# Patient Record
Sex: Female | Born: 1970 | Race: White | Hispanic: No | Marital: Married | Smoking: Never smoker
Health system: Southern US, Community
[De-identification: ages and names within clinical notes are randomized; demographics above are authoritative.]

## PROBLEM LIST (undated history)

## (undated) DIAGNOSIS — T4145XA Adverse effect of unspecified anesthetic, initial encounter: Secondary | ICD-10-CM

## (undated) DIAGNOSIS — T8859XA Other complications of anesthesia, initial encounter: Secondary | ICD-10-CM

## (undated) DIAGNOSIS — R112 Nausea with vomiting, unspecified: Secondary | ICD-10-CM

## (undated) DIAGNOSIS — I10 Essential (primary) hypertension: Secondary | ICD-10-CM

## (undated) DIAGNOSIS — J45909 Unspecified asthma, uncomplicated: Secondary | ICD-10-CM

## (undated) DIAGNOSIS — Z9889 Other specified postprocedural states: Secondary | ICD-10-CM

## (undated) DIAGNOSIS — D649 Anemia, unspecified: Secondary | ICD-10-CM

## (undated) DIAGNOSIS — R51 Headache: Secondary | ICD-10-CM

---

## 1993-11-25 HISTORY — PX: TUBAL LIGATION: SHX77

## 1996-11-25 HISTORY — PX: BACK SURGERY: SHX140

## 2006-11-25 DIAGNOSIS — I10 Essential (primary) hypertension: Secondary | ICD-10-CM

## 2006-11-25 HISTORY — DX: Essential (primary) hypertension: I10

## 2008-05-12 ENCOUNTER — Ambulatory Visit (HOSPITAL_COMMUNITY): Admission: RE | Admit: 2008-05-12 | Discharge: 2008-05-12 | Payer: Self-pay | Admitting: Specialist

## 2009-11-25 HISTORY — PX: OTHER SURGICAL HISTORY: SHX169

## 2010-11-25 HISTORY — PX: CHOLECYSTECTOMY: SHX55

## 2011-11-26 HISTORY — PX: APPENDECTOMY: SHX54

## 2011-11-26 HISTORY — PX: VAGINAL HYSTERECTOMY: SUR661

## 2012-11-24 DIAGNOSIS — M47812 Spondylosis without myelopathy or radiculopathy, cervical region: Secondary | ICD-10-CM | POA: Diagnosis present

## 2012-11-24 NOTE — H&P (Signed)
History of Present Illness The patient is a 41 year old female who presents today for follow up of their neck. The patient is being followed for their left-sided neck pain. Symptoms reported today include: pain. The patient feels that they are doing poorly and report their pain level to be severe. The patient presents today following SNRB (S/P 11 days, no relief). Currently taking tramadol, which is not helping with the pain. Worked on Monday after injection (11/25), but has not been back to work due to pain.  Subjective Transcription  She returns today for follow-up. Unfortunately, she continues to have severe debilitating neck and left arm pain and now increasing right shoulder and scapular pain. The recent C5 injection did not provide the relief that the previous one did. At this point, her quality of life is deteriorating.   Allergies No Known Drug Allergies. 07/21/2012   Social History Alcohol use. never consumed alcohol Children. 2 Current work status. working full time Drug/Alcohol Rehab (Currently). no Drug/Alcohol Rehab (Previously). no Illicit drug use. no Living situation. live with spouse Marital status. married Pain Contract. no Tobacco / smoke exposure. yes Tobacco use. never smoker   Medication History TraMADol HCl (50MG  Tablet, 1 (one) Tablet Oral 1 PO TID, Taken starting 10/02/2012) Active. SUMAtriptan Succinate (50MG  Tablet, Oral) Active. Promethazine HCl (25MG  Tablet, Oral) Active. Orphenadrine Citrate ER (100MG  Tablet ER 12HR, Oral) Active. Naproxen (500MG  Tablet, Oral) Active. Metoprolol Succinate ER (25MG  Tablet ER 24HR, Oral) Active. Lisinopril-Hydrochlorothiazide (20-25MG  Tablet, Oral) Active. Cyclobenzaprine HCl (5MG  Tablet, Oral) Active. Benzonatate (100MG  Capsule, Oral) Active.   Objective Transcription  She has numbness and dysesthesias in the C4 and C5 distribution on the left side. She has pain with Spurling's  maneuver on the left side. There is no C6, C7 or C8 motor sensory deficits in the upper extremities. No SOB/CP Lungs CTA Heart: RRR Abd: soft/NT.  No history of incontinence Ambulating NO shoulder/elbow/wrist pain with ROM Intact UE pulses Motor: R: 5/5 throughout  L: trace deltoid weakness only Sensation and pain in C5 distribution on left side  Neg babinski/clonus. 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    Plans Transcription  We have had a long discussion about surgical intervention. The risks of surgery include infection, bleeding, nerve damage, death, stroke, paralysis, ongoing or worse pain, throat pain, swallowing difficulties, hoarseness in the voice, need for further surgery, nonunion and adjacent segment disease. At this point in time, we have discussed an anterior cervical discectomy and fusion C4-C5 which I think would be the treatment of choice. I have given her some literature and she will contact me and let me know how she would like to proceed. I will see her back in two weeks and we will make a final decision. If she decides before then she will let me know and we will start the process. We will take her out of work as this is only exacerbating her pain.  Alvy Beal, MD/ljs

## 2012-11-27 ENCOUNTER — Encounter (HOSPITAL_COMMUNITY): Payer: Self-pay | Admitting: Pharmacy Technician

## 2012-12-03 ENCOUNTER — Encounter (HOSPITAL_COMMUNITY): Payer: Self-pay

## 2012-12-03 ENCOUNTER — Encounter (HOSPITAL_COMMUNITY)
Admission: RE | Admit: 2012-12-03 | Discharge: 2012-12-03 | Disposition: A | Discharge status: Home / Self Care | Payer: Worker's Compensation | Source: Ambulatory Visit | Attending: Orthopedic Surgery | Admitting: Orthopedic Surgery

## 2012-12-03 HISTORY — DX: Unspecified asthma, uncomplicated: J45.909

## 2012-12-03 HISTORY — DX: Essential (primary) hypertension: I10

## 2012-12-03 LAB — BASIC METABOLIC PANEL
Chloride: 100 mEq/L (ref 96–112)
GFR calc Af Amer: >90 mL/min (ref >90)
GFR calc non Af Amer: >90 mL/min (ref >90)
Potassium: 3.2 mEq/L — ABNORMAL LOW (ref 3.5–5.1)

## 2012-12-03 LAB — SURGICAL PCR SCREEN
MRSA, PCR: NEGATIVE
Staphylococcus aureus: POSITIVE — AB

## 2012-12-03 LAB — CBC
HCT: 44.1 % (ref 36.0–46.0)
Hemoglobin: 14.7 g/dL (ref 12.0–15.0)
RDW: 13.9 % (ref 11.5–15.5)
WBC: 10.5 10*3/uL (ref 4.0–10.5)

## 2012-12-03 NOTE — Progress Notes (Signed)
Pthere for PAT. Family MD: Dr. Carrington Clamp.  Denies sleep apnea.  Reports having EKG & CXR last summer(2013) at [facility]-faxed req for copies.  Pt stated had ECHO and Stress approx 4 years ago-which were negative.  Denies SOB, & chest pain. Faxed req for copies of ECHO and Stress from family MD and [facility].

## 2012-12-03 NOTE — Pre-Procedure Instructions (Signed)
Julia Jarvis [facility]  12/03/2012   Your procedure is scheduled on:  Wednesday, 12/09/2012@1045AM .  Report to [facility] at  8:30AM.  Call this number if you have problems the morning of surgery: [phone]   Remember:   Do not eat food or drink liquids after midnight.   Take these medicines the morning of surgery with A SIP OF WATER: Norco   Do not wear jewelry, make-up or nail polish.  Do not wear lotions, powders, or perfumes. You may wear deodorant.  Do not shave 48 hours prior to surgery. Men may shave face and neck.  Do not bring valuables to the hospital.  Contacts, dentures or bridgework may not be worn into surgery.  Leave suitcase in the car. After surgery it may be brought to your room.  For patients admitted to the hospital, checkout time is 11:00 AM the day of discharge.   Patients discharged the day of surgery will not be allowed to drive home.  Name and phone number of your driver: husband, Julia Jarvis  Special Instructions: Shower using CHG 2 nights before surgery and the night before surgery.  If you shower the day of surgery use CHG.  Use special wash - you have one bottle of CHG for all showers.  You should use approximately 1/3 of the bottle for each shower.   Please read over the following fact sheets that you were given: Pain Booklet, Coughing and Deep Breathing and Surgical Site Infection Prevention

## 2012-12-04 NOTE — Consult Note (Signed)
Chart to be reviewed by Anesthesiologist Dr. Noreene Larsson.  Shonna Chock, PA-C 12/04/12 1815

## 2012-12-05 NOTE — Consult Note (Signed)
Anesthesiology Chart Review:  42 year old female with C4 radiculopathy felt due to C4-5 foraminal stenosis for C4-5 ACDF by Dr. Shon Baton on 12/09/12.  PMH includes migraines and hypertension. Labs reviewed and are acceptable, ECG pending. She should be OK for surgery, the anesthesiologist assigned to her case will evaluate her prior to surgery.  Kipp Brood, MD

## 2012-12-08 MED ORDER — CEFAZOLIN SODIUM-DEXTROSE 2-3 GM-% IV SOLR
2.0000 g | INTRAVENOUS | Status: AC
  Administered 2012-12-09: 2 g via INTRAVENOUS
  Filled 2012-12-08: qty 50

## 2012-12-08 NOTE — Progress Notes (Signed)
Unable to reach patient to notify of surgery time change and new arrival time.  Phone only rings busy. No other numbers available

## 2012-12-09 ENCOUNTER — Encounter (HOSPITAL_COMMUNITY): Payer: Self-pay | Admitting: Vascular Surgery

## 2012-12-09 ENCOUNTER — Ambulatory Visit (HOSPITAL_COMMUNITY): Payer: Worker's Compensation | Admitting: Vascular Surgery

## 2012-12-09 ENCOUNTER — Encounter (HOSPITAL_COMMUNITY): Payer: Self-pay | Admitting: Surgery

## 2012-12-09 ENCOUNTER — Observation Stay (HOSPITAL_COMMUNITY): Payer: Worker's Compensation

## 2012-12-09 ENCOUNTER — Ambulatory Visit (HOSPITAL_COMMUNITY): Payer: Worker's Compensation

## 2012-12-09 ENCOUNTER — Encounter (HOSPITAL_COMMUNITY)
Admission: RE | Disposition: A | Discharge status: Home / Self Care | Payer: Self-pay | Source: Ambulatory Visit | Attending: Orthopedic Surgery

## 2012-12-09 ENCOUNTER — Observation Stay (HOSPITAL_COMMUNITY)
Admission: RE | Admit: 2012-12-09 | Discharge: 2012-12-10 | Disposition: A | Discharge status: Home / Self Care | Payer: Worker's Compensation | Source: Ambulatory Visit | Attending: Orthopedic Surgery | Admitting: Orthopedic Surgery

## 2012-12-09 DIAGNOSIS — I10 Essential (primary) hypertension: Secondary | ICD-10-CM | POA: Insufficient documentation

## 2012-12-09 DIAGNOSIS — M47812 Spondylosis without myelopathy or radiculopathy, cervical region: Principal | ICD-10-CM | POA: Insufficient documentation

## 2012-12-09 DIAGNOSIS — M5030 Other cervical disc degeneration, unspecified cervical region: Secondary | ICD-10-CM | POA: Insufficient documentation

## 2012-12-09 DIAGNOSIS — J45909 Unspecified asthma, uncomplicated: Secondary | ICD-10-CM | POA: Insufficient documentation

## 2012-12-09 HISTORY — PX: ANTERIOR CERVICAL DECOMP/DISCECTOMY FUSION: SHX1161

## 2012-12-09 SURGERY — ANTERIOR CERVICAL DECOMPRESSION/DISCECTOMY FUSION 1 LEVEL
Anesthesia: General | Site: Back | Wound class: Clean

## 2012-12-09 MED ORDER — DOCUSATE SODIUM 100 MG PO CAPS: 100.0000 mg | ORAL_CAPSULE | Freq: Three times a day (TID) | ORAL | Status: DC | PRN

## 2012-12-09 MED ORDER — ZOLPIDEM TARTRATE 5 MG PO TABS: 5.0000 mg | ORAL_TABLET | Freq: Every evening | ORAL | Status: DC | PRN

## 2012-12-09 MED ORDER — ARTIFICIAL TEARS OP OINT
TOPICAL_OINTMENT | OPHTHALMIC | Status: DC | PRN
  Administered 2012-12-09: 1 via OPHTHALMIC

## 2012-12-09 MED ORDER — GLYCOPYRROLATE 0.2 MG/ML IJ SOLN
INTRAMUSCULAR | Status: DC | PRN
  Administered 2012-12-09: 0.4 mg via INTRAVENOUS

## 2012-12-09 MED ORDER — THROMBIN 20000 UNITS EX SOLR
CUTANEOUS | Status: DC | PRN
  Administered 2012-12-09: 11:00:00 via TOPICAL

## 2012-12-09 MED ORDER — LISINOPRIL-HYDROCHLOROTHIAZIDE 20-25 MG PO TABS
1.0000 | ORAL_TABLET | Freq: Every day | ORAL | Status: DC
Start: 2012-12-09 — End: 2012-12-09

## 2012-12-09 MED ORDER — OXYCODONE HCL 5 MG PO TABS
10.0000 mg | ORAL_TABLET | ORAL | Status: DC | PRN
  Administered 2012-12-10: 10 mg via ORAL
  Filled 2012-12-09: qty 2

## 2012-12-09 MED ORDER — MIDAZOLAM HCL 5 MG/5ML IJ SOLN
INTRAMUSCULAR | Status: DC | PRN
  Administered 2012-12-09: 2 mg via INTRAVENOUS

## 2012-12-09 MED ORDER — LACTATED RINGERS IV SOLN
INTRAVENOUS | Status: DC
  Administered 2012-12-09: 10:00:00 via INTRAVENOUS

## 2012-12-09 MED ORDER — METHOCARBAMOL 500 MG PO TABS
500.0000 mg | ORAL_TABLET | Freq: Four times a day (QID) | ORAL | Status: DC | PRN
  Filled 2012-12-09: qty 1

## 2012-12-09 MED ORDER — ONDANSETRON HCL 4 MG/2ML IJ SOLN: 4.0000 mg | Freq: Once | INTRAMUSCULAR | Status: DC | PRN

## 2012-12-09 MED ORDER — BUPIVACAINE-EPINEPHRINE 0.25% -1:200000 IJ SOLN
INTRAMUSCULAR | Status: AC
  Filled 2012-12-09: qty 1

## 2012-12-09 MED ORDER — METHOCARBAMOL 100 MG/ML IJ SOLN
500.0000 mg | Freq: Four times a day (QID) | INTRAVENOUS | Status: DC | PRN
  Filled 2012-12-09 (×2): qty 5

## 2012-12-09 MED ORDER — LACTATED RINGERS IV SOLN
INTRAVENOUS | Status: DC | PRN
  Administered 2012-12-09 (×2): via INTRAVENOUS

## 2012-12-09 MED ORDER — HYDROMORPHONE HCL PF 1 MG/ML IJ SOLN
0.2500 mg | INTRAMUSCULAR | Status: DC | PRN
  Administered 2012-12-09 (×2): 0.5 mg via INTRAVENOUS

## 2012-12-09 MED ORDER — MORPHINE SULFATE 2 MG/ML IJ SOLN
1.0000 mg | INTRAMUSCULAR | Status: DC | PRN
  Administered 2012-12-09 – 2012-12-10 (×3): 2 mg via INTRAVENOUS
  Filled 2012-12-09 (×3): qty 1

## 2012-12-09 MED ORDER — NEOSTIGMINE METHYLSULFATE 1 MG/ML IJ SOLN
INTRAMUSCULAR | Status: DC | PRN
  Administered 2012-12-09: 3 mg via INTRAVENOUS

## 2012-12-09 MED ORDER — LISINOPRIL 20 MG PO TABS
20.0000 mg | ORAL_TABLET | Freq: Every day | ORAL | Status: DC
  Administered 2012-12-09 – 2012-12-10 (×2): 20 mg via ORAL
  Filled 2012-12-09 (×2): qty 1

## 2012-12-09 MED ORDER — BUPIVACAINE-EPINEPHRINE PF 0.25-1:200000 % IJ SOLN
INTRAMUSCULAR | Status: DC | PRN
  Administered 2012-12-09: 4 mL

## 2012-12-09 MED ORDER — DOCUSATE SODIUM 100 MG PO CAPS
100.0000 mg | ORAL_CAPSULE | Freq: Two times a day (BID) | ORAL | Status: DC
  Administered 2012-12-09 – 2012-12-10 (×2): 100 mg via ORAL
  Filled 2012-12-09 (×2): qty 1

## 2012-12-09 MED ORDER — ONDANSETRON HCL 4 MG PO TABS: 4.0000 mg | ORAL_TABLET | Freq: Three times a day (TID) | ORAL | Status: DC | PRN

## 2012-12-09 MED ORDER — LIDOCAINE HCL 4 % MT SOLN
OROMUCOSAL | Status: DC | PRN
  Administered 2012-12-09: 4 mL via TOPICAL

## 2012-12-09 MED ORDER — DEXAMETHASONE SODIUM PHOSPHATE 10 MG/ML IJ SOLN
INTRAMUSCULAR | Status: AC
  Administered 2012-12-09: 10 mg via INTRAVENOUS
  Filled 2012-12-09: qty 1

## 2012-12-09 MED ORDER — ACETAMINOPHEN 10 MG/ML IV SOLN: 1000.0000 mg | Freq: Once | INTRAVENOUS | Status: DC | PRN

## 2012-12-09 MED ORDER — DEXAMETHASONE SODIUM PHOSPHATE 4 MG/ML IJ SOLN
4.0000 mg | Freq: Four times a day (QID) | INTRAMUSCULAR | Status: DC
  Administered 2012-12-10: 4 mg via INTRAVENOUS
  Filled 2012-12-09 (×7): qty 1

## 2012-12-09 MED ORDER — ROCURONIUM BROMIDE 100 MG/10ML IV SOLN
INTRAVENOUS | Status: DC | PRN
  Administered 2012-12-09: 50 mg via INTRAVENOUS

## 2012-12-09 MED ORDER — METHOCARBAMOL 500 MG PO TABS: 500.0000 mg | ORAL_TABLET | Freq: Three times a day (TID) | ORAL | Status: DC | PRN

## 2012-12-09 MED ORDER — OXYCODONE-ACETAMINOPHEN 10-325 MG PO TABS: 1.0000 | ORAL_TABLET | ORAL | Status: DC | PRN

## 2012-12-09 MED ORDER — ONDANSETRON HCL 4 MG/2ML IJ SOLN
INTRAMUSCULAR | Status: DC | PRN
  Administered 2012-12-09: 4 mg via INTRAVENOUS

## 2012-12-09 MED ORDER — LIDOCAINE HCL (CARDIAC) 20 MG/ML IV SOLN
INTRAVENOUS | Status: DC | PRN
  Administered 2012-12-09: 40 mg via INTRAVENOUS

## 2012-12-09 MED ORDER — ACETAMINOPHEN 10 MG/ML IV SOLN
1000.0000 mg | Freq: Once | INTRAVENOUS | Status: AC
  Administered 2012-12-09: 1000 mg via INTRAVENOUS
  Filled 2012-12-09: qty 100

## 2012-12-09 MED ORDER — ACETAMINOPHEN 10 MG/ML IV SOLN
INTRAVENOUS | Status: AC
  Filled 2012-12-09: qty 100

## 2012-12-09 MED ORDER — DEXAMETHASONE SODIUM PHOSPHATE 4 MG/ML IJ SOLN
4.0000 mg | Freq: Once | INTRAMUSCULAR | Status: DC
  Filled 2012-12-09: qty 1

## 2012-12-09 MED ORDER — PHENOL 1.4 % MT LIQD
1.0000 | OROMUCOSAL | Status: DC | PRN
  Filled 2012-12-09: qty 177

## 2012-12-09 MED ORDER — EPHEDRINE SULFATE 50 MG/ML IJ SOLN
INTRAMUSCULAR | Status: DC | PRN
  Administered 2012-12-09 (×2): 5 mg via INTRAVENOUS

## 2012-12-09 MED ORDER — MENTHOL 3 MG MT LOZG: 1.0000 | LOZENGE | OROMUCOSAL | Status: DC | PRN

## 2012-12-09 MED ORDER — VECURONIUM BROMIDE 10 MG IV SOLR
INTRAVENOUS | Status: DC | PRN
  Administered 2012-12-09: 1 mg via INTRAVENOUS

## 2012-12-09 MED ORDER — PROPOFOL 10 MG/ML IV BOLUS
INTRAVENOUS | Status: DC | PRN
  Administered 2012-12-09: 180 mg via INTRAVENOUS

## 2012-12-09 MED ORDER — HYDROMORPHONE HCL PF 1 MG/ML IJ SOLN
INTRAMUSCULAR | Status: AC
  Filled 2012-12-09: qty 1

## 2012-12-09 MED ORDER — CEFAZOLIN SODIUM 1-5 GM-% IV SOLN
1.0000 g | Freq: Three times a day (TID) | INTRAVENOUS | Status: AC
  Administered 2012-12-09 – 2012-12-10 (×2): 1 g via INTRAVENOUS
  Filled 2012-12-09 (×2): qty 50

## 2012-12-09 MED ORDER — METHOCARBAMOL 100 MG/ML IJ SOLN
500.0000 mg | INTRAVENOUS | Status: DC
  Administered 2012-12-09: 500 mg via INTRAVENOUS
  Filled 2012-12-09: qty 5

## 2012-12-09 MED ORDER — DEXAMETHASONE 4 MG PO TABS
4.0000 mg | ORAL_TABLET | Freq: Four times a day (QID) | ORAL | Status: DC
  Administered 2012-12-09 – 2012-12-10 (×2): 4 mg via ORAL
  Filled 2012-12-09 (×7): qty 1

## 2012-12-09 MED ORDER — FENTANYL CITRATE 0.05 MG/ML IJ SOLN
INTRAMUSCULAR | Status: DC | PRN
  Administered 2012-12-09 (×5): 50 ug via INTRAVENOUS
  Administered 2012-12-09: 100 ug via INTRAVENOUS

## 2012-12-09 MED ORDER — POLYETHYLENE GLYCOL 3350 17 GM/SCOOP PO POWD: 17.0000 g | Freq: Every day | ORAL | Status: DC

## 2012-12-09 MED ORDER — ACETAMINOPHEN 10 MG/ML IV SOLN
1000.0000 mg | Freq: Four times a day (QID) | INTRAVENOUS | Status: DC
  Administered 2012-12-09 – 2012-12-10 (×3): 1000 mg via INTRAVENOUS
  Filled 2012-12-09 (×4): qty 100

## 2012-12-09 MED ORDER — THROMBIN 20000 UNITS EX SOLR
CUTANEOUS | Status: AC
  Filled 2012-12-09: qty 20000

## 2012-12-09 MED ORDER — LACTATED RINGERS IV SOLN
INTRAVENOUS | Status: DC
  Administered 2012-12-09: 15:00:00 via INTRAVENOUS

## 2012-12-09 MED ORDER — HYDROCHLOROTHIAZIDE 25 MG PO TABS
25.0000 mg | ORAL_TABLET | Freq: Every day | ORAL | Status: DC
  Administered 2012-12-09 – 2012-12-10 (×2): 25 mg via ORAL
  Filled 2012-12-09 (×2): qty 1

## 2012-12-09 MED ORDER — 0.9 % SODIUM CHLORIDE (POUR BTL) OPTIME
TOPICAL | Status: DC | PRN
  Administered 2012-12-09: 1000 mL

## 2012-12-09 MED ORDER — FLEET ENEMA 7-19 GM/118ML RE ENEM: 1.0000 | ENEMA | Freq: Once | RECTAL | Status: AC | PRN

## 2012-12-09 SURGICAL SUPPLY — 61 items
BLADE SURG ROTATE 9660 (MISCELLANEOUS) IMPLANT
BUR EGG ELITE 4.0 (BURR) IMPLANT
BUR MATCHSTICK NEURO 3.0 LAGG (BURR) IMPLANT
CANISTER SUCTION 2500CC (MISCELLANEOUS) ×2 IMPLANT
CLOTH BEACON ORANGE TIMEOUT ST (SAFETY) ×2 IMPLANT
CLSR STERI-STRIP ANTIMIC 1/2X4 (GAUZE/BANDAGES/DRESSINGS) ×2 IMPLANT
CORDS BIPOLAR (ELECTRODE) ×2 IMPLANT
COVER SURGICAL LIGHT HANDLE (MISCELLANEOUS) ×4 IMPLANT
CRADLE DONUT ADULT HEAD (MISCELLANEOUS) ×2 IMPLANT
DERMABOND ADVANCED (GAUZE/BANDAGES/DRESSINGS) ×1
DERMABOND ADVANCED .7 DNX12 (GAUZE/BANDAGES/DRESSINGS) ×1 IMPLANT
DRAPE C-ARM 42X72 X-RAY (DRAPES) ×2 IMPLANT
DRAPE POUCH INSTRU U-SHP 10X18 (DRAPES) ×2 IMPLANT
DRAPE SURG 17X23 STRL (DRAPES) ×2 IMPLANT
DRAPE U-SHAPE 47X51 STRL (DRAPES) ×2 IMPLANT
DRILL BIT (BIT) ×2 IMPLANT
DRSG MEPILEX BORDER 4X4 (GAUZE/BANDAGES/DRESSINGS) ×2 IMPLANT
DURAPREP 26ML APPLICATOR (WOUND CARE) ×2 IMPLANT
ELECT COATED BLADE 2.86 ST (ELECTRODE) ×2 IMPLANT
ELECT REM PT RETURN 9FT ADLT (ELECTROSURGICAL) ×2
ELECTRODE REM PT RTRN 9FT ADLT (ELECTROSURGICAL) ×1 IMPLANT
GLOVE BIOGEL PI IND STRL 6.5 (GLOVE) ×1 IMPLANT
GLOVE BIOGEL PI IND STRL 8.5 (GLOVE) ×1 IMPLANT
GLOVE BIOGEL PI INDICATOR 6.5 (GLOVE) ×1
GLOVE BIOGEL PI INDICATOR 8.5 (GLOVE) ×1
GLOVE ECLIPSE 6.0 STRL STRAW (GLOVE) ×2 IMPLANT
GLOVE ECLIPSE 8.5 STRL (GLOVE) ×2 IMPLANT
GOWN PREVENTION PLUS XXLARGE (GOWN DISPOSABLE) ×2 IMPLANT
GOWN STRL NON-REIN LRG LVL3 (GOWN DISPOSABLE) ×4 IMPLANT
INTERLOCK LRDTC CRVCL VBR 7MM (Bone Implant) ×1 IMPLANT
KIT BASIN OR (CUSTOM PROCEDURE TRAY) ×2 IMPLANT
KIT ROOM TURNOVER OR (KITS) ×2 IMPLANT
LORDOTIC CERVICAL VBR 7MM SM (Bone Implant) ×2 IMPLANT
NEEDLE SPNL 18GX3.5 QUINCKE PK (NEEDLE) ×2 IMPLANT
NS IRRIG 1000ML POUR BTL (IV SOLUTION) ×2 IMPLANT
PACK ORTHO CERVICAL (CUSTOM PROCEDURE TRAY) ×2 IMPLANT
PACK UNIVERSAL I (CUSTOM PROCEDURE TRAY) ×2 IMPLANT
PAD ARMBOARD 7.5X6 YLW CONV (MISCELLANEOUS) ×4 IMPLANT
PATTIES SURGICAL .25X.25 (GAUZE/BANDAGES/DRESSINGS) IMPLANT
PIN DISTRACTION 14 (PIN) ×2 IMPLANT
PLATE LV 1 12MM (Plate) ×2 IMPLANT
PUTTY BONE DBX 2.5 MIS (Bone Implant) ×2 IMPLANT
SCREW 14MMX25 (Screw) ×2 IMPLANT
SCREW 4.0X14MM (Screw) ×1 IMPLANT
SCREW 4.0X16MM (Screw) ×2 IMPLANT
SCREW BN 14X4XSLF DRL VA SLF (Screw) ×1 IMPLANT
SLEEVE SURGEON STRL (DRAPES) ×2 IMPLANT
SPONGE INTESTINAL PEANUT (DISPOSABLE) ×2 IMPLANT
SPONGE SURGIFOAM ABS GEL 100 (HEMOSTASIS) ×2 IMPLANT
SURGIFLO TRUKIT (HEMOSTASIS) IMPLANT
SUT MNCRL AB 3-0 PS2 18 (SUTURE) ×2 IMPLANT
SUT SILK 2 0 (SUTURE) ×1
SUT SILK 2-0 18XBRD TIE 12 (SUTURE) ×1 IMPLANT
SUT VIC AB 2-0 CT1 18 (SUTURE) ×2 IMPLANT
SYR BULB IRRIGATION 50ML (SYRINGE) ×2 IMPLANT
SYR CONTROL 10ML LL (SYRINGE) ×2 IMPLANT
TAPE CLOTH 4X10 WHT NS (GAUZE/BANDAGES/DRESSINGS) ×2 IMPLANT
TAPE UMBILICAL COTTON 1/8X30 (MISCELLANEOUS) ×2 IMPLANT
TOWEL OR 17X24 6PK STRL BLUE (TOWEL DISPOSABLE) ×2 IMPLANT
TOWEL OR 17X26 10 PK STRL BLUE (TOWEL DISPOSABLE) ×2 IMPLANT
WATER STERILE IRR 1000ML POUR (IV SOLUTION) ×2 IMPLANT

## 2012-12-09 NOTE — Discharge Summary (Signed)
Patient ID: KEUNDRA PETRUCELLI MRN: [id number] DOB/AGE: 1971/04/10 42 y.o.  Admit date: 12/09/2012 Discharge date: 12/09/2012  Admission Diagnoses:  Principal Problem:  *Cervical spondylosis   Discharge Diagnoses:  Principal Problem:  *Cervical spondylosis  status post Procedure(s): ANTERIOR CERVICAL DECOMPRESSION/DISCECTOMY FUSION 1 LEVEL  Past Medical History  Diagnosis Date  . Hypertension 2008  . Asthma     childhood    Surgeries: Procedure(s): ANTERIOR CERVICAL DECOMPRESSION/DISCECTOMY FUSION 1 LEVEL on 12/09/2012   Consultants:  none  Discharged Condition: Improved  Hospital Course: Julia Jarvis is an 42 y.o. female who was admitted 12/09/2012 for operative treatment of Cervical spondylosis. Patient failed conservative treatments (please see the history and physical for the specifics) and had severe unremitting pain that affects sleep, daily activities and work/hobbies. After pre-op clearance, the patient was taken to the operating room on 12/09/2012 and underwent  Procedure(s): ANTERIOR CERVICAL DECOMPRESSION/DISCECTOMY FUSION 1 LEVEL.    Patient was given perioperative antibiotics: Anti-infectives     Start     Dose/Rate Route Frequency Ordered Stop   12/08/12 1453   ceFAZolin (ANCEF) IVPB 2 g/50 mL premix        2 g 100 mL/hr over 30 Minutes Intravenous 30 min pre-op 12/08/12 1453 12/09/12 1017           Patient was given sequential compression devices and early ambulation to prevent DVT.   Patient benefited maximally from hospital stay and there were no complications. At the time of discharge, the patient was urinating/moving their bowels without difficulty, tolerating a regular diet, pain is controlled with oral pain medications and they have been cleared by PT/OT.   Recent vital signs: Patient Vitals for the past 24 hrs:  BP Temp Temp src Pulse Resp SpO2  12/09/12 0837 162/93 mmHg - - - - -  12/09/12 0832 - 98.2 F (36.8 C) Oral 80  18  100 %      Recent laboratory studies: No results found for this basename: WBC:2,HGB:2,HCT:2,PLT:2,NA:2,K:2,CL:2,CO2:2,BUN:2,CREATININE:2,GLUCOSE:2,PT:2,INR:2,CALCIUM,2: in the last 72 hours   Discharge Medications:     Medication List     As of 12/09/2012 12:32 PM    STOP taking these medications         ADVIL COLD/SINUS 30-200 MG Tabs   Generic drug: Pseudoephedrine-Ibuprofen      HYDROcodone-acetaminophen 5-325 MG per tablet   Commonly known as: NORCO/VICODIN      TAKE these medications         docusate sodium 100 MG capsule   Commonly known as: COLACE   Take 1 capsule (100 mg total) by mouth 3 (three) times daily as needed for constipation.      lisinopril-hydrochlorothiazide 20-25 MG per tablet   Commonly known as: PRINZIDE,ZESTORETIC   Take 1 tablet by mouth daily.      methocarbamol 500 MG tablet   Commonly known as: ROBAXIN   Take 1 tablet (500 mg total) by mouth 3 (three) times daily as needed.      ondansetron 4 MG tablet   Commonly known as: ZOFRAN   Take 1 tablet (4 mg total) by mouth every 8 (eight) hours as needed for nausea.      oxyCODONE-acetaminophen 10-325 MG per tablet   Commonly known as: PERCOCET   Take 1 tablet by mouth every 4 (four) hours as needed for pain.      polyethylene glycol powder powder   Commonly known as: GLYCOLAX/MIRALAX   Take 17 g by mouth daily.  Diagnostic Studies: Dg Chest 2 View  12/09/2012  *RADIOLOGY REPORT*  Clinical Data: Cervical disc disease.  CHEST - 2 VIEW  Comparison: None.  Findings: The heart size and pulmonary vascularity are normal and the lungs are clear.  No significant osseous abnormality.  IMPRESSION: Normal chest.   Original Report Authenticated By: Francene Boyers, M.D.           Follow-up Information    Follow up with Alvy Beal, MD. Call in 2 weeks. (As needed if symptoms worsen)    Contact information:   [address] [place]  [address] [phone]          Discharge Plan:  discharge to .home  Disposition:  Home Doing well  F/u 2 weeks   Signed: Venita Lick D for Dr. Venita Lick [facility] [phone] 12/09/2012, 12:32 PM

## 2012-12-09 NOTE — Preoperative (Signed)
Beta Blockers   Reason not to administer Beta Blockers:Not Applicable 

## 2012-12-09 NOTE — Brief Op Note (Signed)
12/09/2012  12:26 PM  PATIENT:  Julia Jarvis  42 y.o. female  PRE-OPERATIVE DIAGNOSIS:  CERVICAL SPONDYLOTIC RADICULOPATHY   POST-OPERATIVE DIAGNOSIS:  CERVICAL SPONDYLOTIC RADICULOPATHY   PROCEDURE:  Procedure(s) (LRB) with comments: ANTERIOR CERVICAL DECOMPRESSION/DISCECTOMY FUSION 1 LEVEL (N/A) - ACDF C4-C5   SURGEON:  Surgeon(s) and Role:    * Venita Lick, MD - Primary  PHYSICIAN ASSISTANT:   ASSISTANTS: none   ANESTHESIA:   general  EBL:  Total I/O In: 1000 [I.V.:1000] Out: 30 [Blood:30]  BLOOD ADMINISTERED:none  DRAINS: none   LOCAL MEDICATIONS USED:  MARCAINE     SPECIMEN:  No Specimen  DISPOSITION OF SPECIMEN:  N/A  COUNTS:  YES  TOURNIQUET:  * No tourniquets in log *  DICTATION: .Other Dictation: Dictation Number [id number]  PLAN OF CARE: Admit for overnight observation  PATIENT DISPOSITION:  PACU - hemodynamically stable.

## 2012-12-09 NOTE — Anesthesia Postprocedure Evaluation (Signed)
  Anesthesia Post-op Note  Patient: Julia Jarvis [facility]  Procedure(s) Performed: Procedure(s) (LRB) with comments: ANTERIOR CERVICAL DECOMPRESSION/DISCECTOMY FUSION 1 LEVEL (N/A) - ACDF C4-C5   Patient Location: PACU  Anesthesia Type:General  Level of Consciousness: awake, alert  and oriented  Airway and Oxygen Therapy: Patient Spontanous Breathing and Patient connected to nasal cannula oxygen  Post-op Pain: mild  Post-op Assessment: Post-op Vital signs reviewed and Patient's Cardiovascular Status Stable  Post-op Vital Signs: stable  Complications: No apparent anesthesia complications

## 2012-12-09 NOTE — Transfer of Care (Signed)
Immediate Anesthesia Transfer of Care Note  Patient: Julia Jarvis [facility]  Procedure(s) Performed: Procedure(s) (LRB) with comments: ANTERIOR CERVICAL DECOMPRESSION/DISCECTOMY FUSION 1 LEVEL (N/A) - ACDF C4-C5   Patient Location: PACU  Anesthesia Type:General  Level of Consciousness: awake, alert , oriented and patient cooperative  Airway & Oxygen Therapy: Patient Spontanous Breathing and Patient connected to nasal cannula oxygen  Post-op Assessment: Report given to PACU RN and Post -op Vital signs reviewed and stable  Post vital signs: Reviewed and stable  Complications: No apparent anesthesia complications

## 2012-12-09 NOTE — Anesthesia Procedure Notes (Signed)
Procedure Name: Intubation Date/Time: 12/09/2012 10:28 AM Performed by: Lovie Chol Pre-anesthesia Checklist: Patient identified, Emergency Drugs available, Suction available, Patient being monitored and Timeout performed Patient Re-evaluated:Patient Re-evaluated prior to inductionOxygen Delivery Method: Circle system utilized Preoxygenation: Pre-oxygenation with 100% oxygen Intubation Type: IV induction Ventilation: Mask ventilation without difficulty and Oral airway inserted - appropriate to patient size Laryngoscope Size: Miller and 2 Grade View: Grade I Tube type: Oral Tube size: 7.0 mm Number of attempts: 1 Airway Equipment and Method: Stylet and LTA kit utilized Placement Confirmation: ETT inserted through vocal cords under direct vision,  positive ETCO2,  CO2 detector and breath sounds checked- equal and bilateral Secured at: 21 cm Tube secured with: Tape Dental Injury: Teeth and Oropharynx as per pre-operative assessment

## 2012-12-09 NOTE — H&P (Signed)
No change in clinical exam H+P reviewed  

## 2012-12-09 NOTE — Anesthesia Preprocedure Evaluation (Addendum)
Anesthesia Evaluation  Patient identified by MRN, date of birth, ID band Patient awake    Reviewed: Allergy & Precautions, H&P , NPO status , Patient's Chart, lab work & pertinent test results  History of Anesthesia Complications Negative for: history of anesthetic complications  Airway Mallampati: II TM Distance: >3 FB Neck ROM: Full    Dental  (+) Teeth Intact, Dental Advisory Given, Loose and Poor Dentition,    Pulmonary asthma ,  breath sounds clear to auscultation        Cardiovascular hypertension, Pt. on medications Rhythm:Regular Rate:Normal     Neuro/Psych  Neuromuscular disease    GI/Hepatic negative GI ROS, Neg liver ROS,   Endo/Other  negative endocrine ROS  Renal/GU negative Renal ROS  negative genitourinary   Musculoskeletal negative musculoskeletal ROS (+)   Abdominal   Peds  Hematology negative hematology ROS (+)   Anesthesia Other Findings   Reproductive/Obstetrics negative OB ROS                          Anesthesia Physical Anesthesia Plan  ASA: II  Anesthesia Plan: General   Post-op Pain Management:    Induction: Intravenous  Airway Management Planned: Oral ETT  Additional Equipment:   Intra-op Plan:   Post-operative Plan: Extubation in OR  Informed Consent: I have reviewed the patients History and Physical, chart, labs and discussed the procedure including the risks, benefits and alternatives for the proposed anesthesia with the patient or authorized representative who has indicated his/her understanding and acceptance.   Dental advisory given  Plan Discussed with: CRNA and Surgeon  Anesthesia Plan Comments: (C4-5 foraminal stenosis with radiculopathy Htn Migraines Loose lower front teeth  Plan GA with oral ETT  Kipp Brood, MD)        Anesthesia Quick Evaluation

## 2012-12-09 NOTE — Anesthesia Postprocedure Evaluation (Signed)
  Anesthesia Post-op Note  Patient: Julia Jarvis [facility]  Procedure(s) Performed: Procedure(s) (LRB) with comments: ANTERIOR CERVICAL DECOMPRESSION/DISCECTOMY FUSION 1 LEVEL (N/A) - ACDF C4-C5   Patient Location: PACU  Anesthesia Type:General  Level of Consciousness: awake, alert  and oriented  Airway and Oxygen Therapy: Patient Spontanous Breathing and Patient connected to nasal cannula oxygen  Post-op Pain: mild  Post-op Assessment: Post-op Vital signs reviewed, Patient's Cardiovascular Status Stable, Respiratory Function Stable, Patent Airway and Pain level controlled  Post-op Vital Signs: stable  Complications: No apparent anesthesia complications

## 2012-12-09 NOTE — Anesthesia Postprocedure Evaluation (Signed)
  Anesthesia Post-op Note  Patient: Julia Jarvis  Procedure(s) Performed: Procedure(s) (LRB) with comments: ANTERIOR CERVICAL DECOMPRESSION/DISCECTOMY FUSION 1 LEVEL (N/A) - ACDF C4-C5   Patient Location: PACU  Anesthesia Type:General  Level of Consciousness: awake, alert  and oriented  Airway and Oxygen Therapy: Patient Spontanous Breathing and Patient connected to nasal cannula oxygen  Post-op Pain: mild  Post-op Assessment: Post-op Vital signs reviewed and Patient's Cardiovascular Status Stable  Post-op Vital Signs: stable  Complications: No apparent anesthesia complications 

## 2012-12-10 NOTE — Plan of Care (Signed)
Problem: Consults Goal: Diagnosis - Spinal Surgery Outcome: Completed/Met Date Met:  12/10/12 Cervical Spine Fusion     Variance: Not applicable Comments: Surgery acdf see notes surgeon for details

## 2012-12-10 NOTE — Evaluation (Signed)
Physical Therapy Evaluation Patient Details Name: Julia Jarvis MRN: [id number] DOB: Jul 07, 1971 Today's Date: 12/10/2012 Time: 1308-6578 PT Time Calculation (min): 21 min  PT Assessment / Plan / Recommendation Clinical Impression  pt presents with C4-5 ACDF.  pt moving well and will have help from husband at D/C.  No further PT needs at this time.  Will sign off.      PT Assessment  Patent does not need any further PT services    Follow Up Recommendations  No PT follow up    Does the patient have the potential to tolerate intense rehabilitation      Barriers to Discharge        Equipment Recommendations   Lexicographer)    Recommendations for Other Services     Frequency      Precautions / Restrictions Precautions Precautions: Cervical Required Braces or Orthoses: Cervical Brace Cervical Brace: Hard collar;Applied in sitting position Restrictions Weight Bearing Restrictions: No   Pertinent Vitals/Pain Indicates pain, but does not rate.        Mobility  Bed Mobility Bed Mobility: Rolling Left;Left Sidelying to Sit;Sitting - Scoot to [place] of Bed;Sit to Sidelying Left Rolling Left: 4: Min assist Left Sidelying to Sit: 4: Min assist Sitting - Scoot to Edge of Bed: 4: Min assist Sit to Sidelying Left: 4: Min assist Details for Bed Mobility Assistance: pt relies on husband to A with mobility to increase her comfort.  Feel pt could have performed this on her own, however pt and huband refused to try.   Transfers Transfers: Sit to Stand;Stand to Sit Sit to Stand: 4: Min guard;With upper extremity assist;From bed;From toilet Stand to Sit: 4: Min guard;With upper extremity assist;To bed;To toilet Details for Transfer Assistance: demos good use of UEs, still requesting husband to hold on to her.   Ambulation/Gait Ambulation/Gait Assistance: 5: Supervision Ambulation Distance (Feet): 20 Feet (x2) Assistive device: None Ambulation/Gait Assistance Details: pt declining to  ambulate out of room, however demos good mobility and balance ambulating within room.   Gait Pattern: Step-through pattern;Decreased stride length Stairs: No Wheelchair Mobility Wheelchair Mobility: No    Shoulder Instructions     Exercises     PT Diagnosis:    PT Problem List:   PT Treatment Interventions:     PT Goals    Visit Information  Last PT Received On: 12/10/12 Assistance Needed: +1    Subjective Data  Subjective: I did all this after my back surgery.   Patient Stated Goal: Home   Prior Functioning  Home Living Lives With: Spouse Available Help at Discharge: Family;Available 24 hours/day (until Monday.  ) Type of Home: House Home Access: Stairs to enter [employer] of Steps: 2 Home Layout: One level Bathroom Shower/Tub: Engineer, manufacturing systems: Standard Home Adaptive Equipment: None Prior Function Level of Independence: Independent Able to Take Stairs?: Yes Driving: Yes Communication Communication: No difficulties    Cognition  Overall Cognitive Status: Appears within functional limits for tasks assessed/performed Arousal/Alertness: Awake/alert Orientation Level: Appears intact for tasks assessed Behavior During Session: [facility] for tasks performed    Extremity/Trunk Assessment Right Lower Extremity Assessment RLE ROM/Strength/Tone: WFL for tasks assessed RLE Sensation: WFL - Light Touch Left Lower Extremity Assessment LLE ROM/Strength/Tone: WFL for tasks assessed LLE Sensation: WFL - Light Touch Trunk Assessment Trunk Assessment: Normal   Balance Balance Balance Assessed: No  End of Session PT - End of Session Equipment Utilized During Treatment: Cervical collar Activity Tolerance: Patient tolerated treatment well Patient  left: in bed;with call bell/phone within reach;with family/visitor present Nurse Communication: Mobility status  GP Functional Assessment Tool Used: Clinical Judgement Functional Limitation: Mobility:  Walking and moving around Mobility: Walking and Moving Around Current Status (Z6109): At least 1 percent but less than 20 percent impaired, limited or restricted Mobility: Walking and Moving Around Goal Status [phone]): At least 1 percent but less than 20 percent impaired, limited or restricted Mobility: Walking and Moving Around Discharge Status [phone]): At least 1 percent but less than 20 percent impaired, limited or restricted   Sunny Schlein, [place] [phone] 12/10/2012, 11:55 AM

## 2012-12-10 NOTE — Progress Notes (Signed)
Patient discharged in stable condition via wheelchair. Discharge instructions and prescriptions were given and explained 

## 2012-12-10 NOTE — Progress Notes (Signed)
    Subjective: Procedure(s) (LRB): ANTERIOR CERVICAL DECOMPRESSION/DISCECTOMY FUSION 1 LEVEL (N/A) 1 Day Post-Op  Patient reports pain as 3 on 0-10 scale.  Reports decreased arm pain reports incisional neck pain   Positive void Negative bowel movement Positive flatus Negative chest pain or shortness of breath  Objective: Vital signs in last 24 hours: Temp:  [97 F (36.1 C)-99.1 F (37.3 C)] 98 F (36.7 C) (01/16 0532) Pulse Rate:  [73-107] 98  (01/16 0532) Resp:  [11-24] 18  (01/16 0532) BP: (120-162)/(38-93) 135/70 mmHg (01/16 0532) SpO2:  [95 %-100 %] 100 % (01/16 0532)  Intake/Output from previous day: 01/15 0701 - 01/16 0700 In: 1544 [I.V.:1489; IV Piggyback:55] Out: 30 [Blood:30]  Labs: No results found for this basename: WBC:2,RBC:2,HCT:2,PLT:2 in the last 72 hours No results found for this basename: NA:2,K:2,CL:2,CO2:2,BUN:2,CREATININE:2,GLUCOSE:2,CALCIUM:2 in the last 72 hours No results found for this basename: LABPT:2,INR:2 in the last 72 hours  Physical Exam: Neurologically intact ABD soft Neurovascular intact Intact pulses distally Incision: dressing C/D/I and no drainage No cellulitis present Compartment soft  Assessment/Plan: Patient stable  xrays satisfactory Mobilization with physical therapy Encourage incentive spirometry Continue care  Advance diet Up with therapy D/C IV fluids Plan for d/c to home today  Venita Lick, MD [facility] [phone]

## 2012-12-10 NOTE — Progress Notes (Signed)
Utilization review completed. Adrin Julian, RN, BSN. 

## 2012-12-10 NOTE — Op Note (Signed)
NAMETYKERIA, WAWRZYNIAK NO.:  [id number]  MEDICAL RECORD NO.:  [id number]  LOCATION:  5N28C                        FACILITY:  [facility]  PHYSICIAN:  Alvy Beal, MD    DATE OF BIRTH:  06-21-1971  DATE OF PROCEDURE:  12/09/2012 DATE OF DISCHARGE:                              OPERATIVE REPORT   PREOPERATIVE DIAGNOSIS:  Cervical spondylitic radiculopathy.  POSTOPERATIVE DIAGNOSIS:  Cervical spondylitic radiculopathy.  OPERATIVE PROCEDURE:  Anterior cervical diskectomy and fusion C4-5.  COMPLICATIONS:  None.  CONDITION:  Stable.  INSTRUMENTATION SYSTEM USED:  A 7 small Titan titanium lordotic interbody spacer packed with DBX mix with a 12 anterior cervical Synthes Vectra plate, 16 mm screws into the body of C4, 14 mm screws into the body of C5.  HISTORY:  This is a very pleasant woman who has been having severe debilitating neck pain for sometime now.  Despite appropriate preoperative nonoperative management, she continued to decompensate.  As a result, the decision was made to take her to the operating room.  All appropriate risks, benefits, and alternatives were discussed with the patient and consent was obtained.  OPERATIVE NOTE:  The patient was brought to the operating room, placed supine on the operating table.  After successful induction of general anesthesia and a trach intubation, TEDs, SCDs were placed and the patient was rolled with towels and placed between the shoulder blades, and the arms were secured down to side.  Anterior cervical spine was prepped and draped in a standard fashion.  Time-out was done to confirm the patient, procedure, and all other pertinent important data.  Once this was done, incision site was marked out, infiltrated with Marcaine and then incised.  Sharp dissection was carried out through the adipose tissue, down through the platysma and then sharply dissected along the medial border of the sternocleidomastoid until I was  into the deep cervical and prevertebral fascia.  I then swept with a finger bluntly dissecting the trachea and esophagus to the right hand side, and then palpating the carotid sheath with my finger on the left.  I then placed self-retaining retractors.  I then placed a thyroid retractor, and exposed the anterior longitudinal ligament.  I placed a needle into the L4-5 disk space and took an x-ray to confirm, I was at the appropriate level.  Once this was done, I then mobilized the longus coli muscles from the midbody of C4 to the midbody of C5 bilaterally.  I placed a self-retaining retractors underneath the longus coli muscles, deflated the endotracheal cuff, expanded the retractors to the appropriate width and then re-expanded the endotracheal cuff.  An annulotomy was performed with a 15 blade scalpel, then using a combination of pituitary rongeurs, curettes, and Kerrison rongeurs, I removed the bulk of the disk material.  I then placed distraction pins into the bodies of C4 and C5, distracted the intervertebral space and then maintained the distraction pins.  I then dissected with a micro nerve hook and a small curettes removing the remainder of the posterior anulus down to the posterior longitudinal ligament.  I then used a small nerve hook to develop a plane underneath the posterior longitudinal ligament  and exploited this with my 1 mm Kerrison to resect the posterior longitudinal ligament in its entirety.  At this point, I could clearly see the anterior aspect of the thecal sac.  I was then able to get underneath the uncovertebral joint and resect the osteophytes.  At this point, I had an adequate decompression.  I freed palpation with a nerve hook underneath the vertebral bodies and underneath the uncovertebral joints.  I rasped the endplates until I had bleeding subchondral bone.  I irrigated the wound copiously with normal saline and then used trial devices and elected to use the  7 small intervertebral spacers and provided the best fit.  I packed it with DBX mix and malleted to the appropriate depth.  Once this was done, I took a 12 mm anterior cervical Synthes, removed the distraction pins and then secured with self-drilling screws.  The anterior plate to the surface of the vertebral bodies.  I had excellent purchase with all 4 screws.  I then removed the remainder of the retractors, irrigated again, and checked to ensure the esophagus and trachea were not entrapped beneath the plate, and I had hemostasis.  Once this was done, I returned the trachea and esophagus to midline, closed the platysma with interrupted 2- 0 Vicryl sutures, superficial in a layer of 2-0 Vicryl sutures, and then the skin with a 3-0 Monocryl.  Steri-Strips and dry dressing were applied.  The patient was extubated to enter to the PACU without incident.  At the end of the case, all needle and sponge counts were correct.  There was no adverse intraoperative events.     Alvy Beal, MD     DDB/MEDQ  D:  12/09/2012  T:  12/10/2012  Job:  [id number]

## 2012-12-11 ENCOUNTER — Encounter (HOSPITAL_COMMUNITY): Payer: Self-pay | Admitting: Orthopedic Surgery

## 2013-01-19 ENCOUNTER — Emergency Department (HOSPITAL_COMMUNITY)
Admission: EM | Admit: 2013-01-19 | Discharge: 2013-01-19 | Disposition: A | Discharge status: Home / Self Care | Payer: Worker's Compensation | Attending: Emergency Medicine | Admitting: Emergency Medicine

## 2013-01-19 ENCOUNTER — Encounter (HOSPITAL_COMMUNITY): Payer: Self-pay | Admitting: Emergency Medicine

## 2013-01-19 DIAGNOSIS — J45901 Unspecified asthma with (acute) exacerbation: Secondary | ICD-10-CM | POA: Insufficient documentation

## 2013-01-19 DIAGNOSIS — R21 Rash and other nonspecific skin eruption: Secondary | ICD-10-CM | POA: Insufficient documentation

## 2013-01-19 DIAGNOSIS — T50995A Adverse effect of other drugs, medicaments and biological substances, initial encounter: Secondary | ICD-10-CM | POA: Insufficient documentation

## 2013-01-19 DIAGNOSIS — Z79899 Other long term (current) drug therapy: Secondary | ICD-10-CM | POA: Insufficient documentation

## 2013-01-19 DIAGNOSIS — I10 Essential (primary) hypertension: Secondary | ICD-10-CM | POA: Insufficient documentation

## 2013-01-19 DIAGNOSIS — M542 Cervicalgia: Secondary | ICD-10-CM | POA: Insufficient documentation

## 2013-01-19 DIAGNOSIS — T7840XA Allergy, unspecified, initial encounter: Secondary | ICD-10-CM

## 2013-01-19 DIAGNOSIS — R51 Headache: Secondary | ICD-10-CM | POA: Insufficient documentation

## 2013-01-19 MED ORDER — ONDANSETRON 4 MG PO TBDP
4.0000 mg | ORAL_TABLET | Freq: Once | ORAL | Status: AC
  Administered 2013-01-19: 4 mg via ORAL
  Filled 2013-01-19: qty 1

## 2013-01-19 MED ORDER — OXYCODONE-ACETAMINOPHEN 5-325 MG PO TABS
1.0000 | ORAL_TABLET | Freq: Once | ORAL | Status: AC
  Administered 2013-01-19: 1 via ORAL
  Filled 2013-01-19: qty 1

## 2013-01-19 MED ORDER — PREDNISONE 20 MG PO TABS: 60.0000 mg | ORAL_TABLET | Freq: Every day | ORAL | Status: DC

## 2013-01-19 MED ORDER — PREDNISONE 20 MG PO TABS
60.0000 mg | ORAL_TABLET | Freq: Once | ORAL | Status: AC
  Administered 2013-01-19: 60 mg via ORAL
  Filled 2013-01-19: qty 3

## 2013-01-19 NOTE — ED Notes (Addendum)
Pt here via ems for s/p allergic reaction. Pt stated that she was seen today in Orthro clinic for injection it is now questionable if is was Marcaine,spray Lidocaine or Depro medrol pt is a0x4 flushed.Benadryl 25mg  po given

## 2013-01-19 NOTE — ED Notes (Signed)
[id number]<YN> Expected date:<BR> Expected time:<BR> Means of arrival:<BR> Comments:<BR> 42yo-f cramping/SOB after having a cortosone shot to shoulder

## 2013-01-19 NOTE — ED Provider Notes (Signed)
History     CSN: [id number]  Arrival date & time 01/19/13  1230   First MD Initiated Contact with Patient 01/19/13 1305      Chief Complaint  Patient presents with  . Allergic Reaction    (Consider location/radiation/quality/duration/timing/severity/associated sxs/prior treatment) Patient is a 42 y.o. female presenting with allergic reaction. The history is provided by the patient.  Allergic Reaction The primary symptoms are  wheezing and rash. The primary symptoms do not include shortness of breath, abdominal pain, nausea, vomiting or diarrhea.   patient presents with possible allergic reaction to injection of her right shoulder. She was at the orthopedics office in and around 11 AM had an injection of lidocaine, Marcaine, and Depo-Medrol. Shortly after she began to have flushing and difficulty breathing. She received Benadryl there. She states she's felt somewhat better now. No history of allergic reactions, however does have a history of childhood asthma. She states the rash started on her lower legs and worked his way up. She's now somewhat red all over. No swelling of her mouth her tongue. She states she does have a pounding headache.  Past Medical History  Diagnosis Date  . Hypertension 2008  . Asthma     childhood    Past Surgical History  Procedure Laterality Date  . Appendectomy  2013  . Abdominal hysterectomy  2013  . Cholecystectomy  2012  . Reversal of tubal ligation  2011  . Tubal ligation  1995  . Back surgery  11/1996  . Anterior cervical decomp/discectomy fusion  12/09/2012    Procedure: ANTERIOR CERVICAL DECOMPRESSION/DISCECTOMY FUSION 1 LEVEL;  Surgeon: Venita Lick, MD;  Location: [facility] OR;  Service: Orthopedics;  Laterality: N/A;  ACDF C4-C5     No family history on file.  History  Substance Use Topics  . Smoking status: Never Smoker   . Smokeless tobacco: Not on file  . Alcohol Use: No    OB History   Grav Para Term Preterm Abortions TAB SAB Ect Mult  Living                  Review of Systems  Constitutional: Negative for activity change and appetite change.  HENT: Positive for neck pain. Negative for neck stiffness.   Eyes: Negative for pain.  Respiratory: Positive for wheezing. Negative for chest tightness and shortness of breath.   Cardiovascular: Negative for chest pain and leg swelling.  Gastrointestinal: Negative for nausea, vomiting, abdominal pain and diarrhea.  Genitourinary: Negative for flank pain.  Musculoskeletal: Negative for back pain.  Skin: Positive for rash.  Neurological: Positive for headaches. Negative for weakness and numbness.  Psychiatric/Behavioral: Negative for behavioral problems.    Allergies  Corticosteroids  Home Medications   Current Outpatient Rx  Name  Route  Sig  Dispense  Refill  . docusate sodium (COLACE) 100 MG capsule   Oral   Take 1 capsule (100 mg total) by mouth 3 (three) times daily as needed for constipation.   30 capsule   0   . lisinopril-hydrochlorothiazide (PRINZIDE,ZESTORETIC) 20-25 MG per tablet   Oral   Take 1 tablet by mouth daily.         . methocarbamol (ROBAXIN) 500 MG tablet   Oral   Take 1 tablet (500 mg total) by mouth 3 (three) times daily as needed.   60 tablet   0   . ondansetron (ZOFRAN) 4 MG tablet   Oral   Take 1 tablet (4 mg total) by mouth every 8 (eight)  hours as needed for nausea.   20 tablet   0   . oxyCODONE-acetaminophen (PERCOCET) 10-325 MG per tablet   Oral   Take 1 tablet by mouth every 4 (four) hours as needed for pain.   60 tablet   0   . polyethylene glycol powder (GLYCOLAX) powder   Oral   Take 17 g by mouth daily.   255 g   1   . predniSONE (DELTASONE) 20 MG tablet   Oral   Take 3 tablets (60 mg total) by mouth daily.   3 tablet   0     BP 145/84  Pulse 135  Temp(Src) 97.9 F (36.6 C) (Oral)  Resp 24  SpO2 97%  Physical Exam  Nursing note and vitals reviewed. Constitutional: She is oriented to person, place,  and time. She appears well-developed and well-nourished.  HENT:  Head: Normocephalic and atraumatic.  Mouth/Throat: No oropharyngeal exudate.  Eyes: EOM are normal. Pupils are equal, round, and reactive to light.  Neck: Normal range of motion. Neck supple.  Cardiovascular: Regular rhythm and normal heart sounds.   No murmur heard. tachycardia  Pulmonary/Chest: Effort normal and breath sounds normal. No respiratory distress. She has no wheezes. She has no rales.  Abdominal: Soft. Bowel sounds are normal. She exhibits no distension. There is no tenderness. There is no rebound and no guarding.  Musculoskeletal: Normal range of motion.  Neurological: She is alert and oriented to person, place, and time. No cranial nerve deficit.  Skin: Skin is warm and dry. Rash noted.  Mild diffuse flushing. No clear hives  Psychiatric: She has a normal mood and affect. Her speech is normal.    ED Course  Procedures (including critical care time)  Labs Reviewed - No data to display No results found.   1. Allergic reaction       MDM  Patient possible allergic reaction after injection to shoulder. Patient had flushing and itching without a clear hives. Patient feels better now after some steroids and Benadryl. She'll be discharged home        Juliet Rude. Rubin Payor, MD 01/19/13 [phone]

## 2013-03-15 ENCOUNTER — Other Ambulatory Visit: Payer: Self-pay | Admitting: Orthopedic Surgery

## 2013-03-15 ENCOUNTER — Ambulatory Visit (INDEPENDENT_AMBULATORY_CARE_PROVIDER_SITE_OTHER): Payer: Worker's Compensation

## 2013-03-15 DIAGNOSIS — Z981 Arthrodesis status: Secondary | ICD-10-CM

## 2013-03-15 DIAGNOSIS — M47812 Spondylosis without myelopathy or radiculopathy, cervical region: Secondary | ICD-10-CM

## 2013-11-09 NOTE — H&P (Signed)
History of Present Illness The patient is a 42 year old female who presents today for follow up of their neck. The patient is being followed for their right-sided neck pain. 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 Symptoms reported today include: pain (radiates into right arm, a little but mainly in the shoulder. She continues with headaches although she did 7 days without a headaches after the SNRB by Dr Ethelene Hal on 10/08/13), aching, pain with overhead motions, numbness (right arm, occasional) and pain with lying. The patient feels that they are doing poorly and report their pain level to be 7 / 10. Current treatment includes: activity modification, NSAIDs and pain medications. The following medication has been used for pain control: antiinflammatory medication (Naproxen) and Hydrocodone (and Robaxin and is requesting a refill on all meds). The patient presents today following SNRB (on 10/08/13). Note for "Follow-up Neck": Dr Ethelene Hal referred back for another surgery at C6   Subjective Transcription  She returns today for follow up. She continues to have significant neck pain, difficulty with range of motion and radiation of pain into the right arm in the C6 distribution.    Allergies No Known Drug Allergies. 07/21/2012    Social History Current work status. working full time Alcohol use. never consumed alcohol Drug/Alcohol Rehab (Previously). no Tobacco use. Never smoker. never smoker Drug/Alcohol Rehab (Currently). no Children. 2 Living situation. live with spouse Marital status. married Pain Contract. no Illicit drug use. no Tobacco / smoke exposure. yes    Medication History Lisinopril-Hydrochlorothiazide (20-25MG  Tablet, Oral) Active. (qd) Norco (10-325MG  Tablet, Oral) Active. (tid) Robaxin (500MG  Tablet, Oral) Active. (tid) Medications Reconciled.    Objective  Transcription  While she has no focal motor deficits, she has numbness, dysesthesias and pain in this C6 distribution. Normal gait pattern. No shortness of breath or chest pain. The patient had an excellent result with the recent C6 right sided selective nerve root block. This was done by my partner, Dr. Ethelene Hal on 10/08/13. It was an uncomplicated injection.   I have had an opportunity to review the MRI from 08/12/13 with the patient and her husband. 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 At C5-6 there is compression of the right C6 nerve root from bone spur. This has increased since her previous MRI of August of 2013. On 07/01/12 she had mild degenerative disease at 5-6 and minimal posterior bone spur, now she has a much more prominent bone spur with nerve compression.    Assessment & Plan  Plans Transcription  At this point we have had a long discussion and she would like to proceed with revision surgery. While she didn't have 100% relief, she was able to sleep. Her quality of life was significantly improved and she was actually not taking any narcotic medications for those 4 days.    At this point, I am going to refill her medications, Norco and Robaxin. We will set her up for an ACDF at C5-6, removal of hardware at C4-5, exploration with a 4-5 fusion. Before that, we will get the ENT evaluation. At this point I think some point in mid to late January is a reasonable time frame to plan on the actual surgery. Continue her on her current restrictions, no changes. We have reviewed all the risks which include infection and bleeding, nerve damage, death, stroke, paralysis, failure to heal, need for further surgery, ongoing or worse pain, adjacent segment collapse, throat pain, swallowing  difficulty, hoarseness in the voice, need for posterior surgery. Because this is a second level, I would also recommend the external bone stimulator following surgery.    At this  point it is clear that as the result of her work related injury, she required a C4-5 ACDF. Subsequently she has developed adjacent segment disease which is now symptomatic. Given that there is a causal relationship between her work related injury and the current need for treatment at the adjacent level.

## 2013-12-09 ENCOUNTER — Encounter (HOSPITAL_COMMUNITY): Payer: Self-pay | Admitting: Pharmacy Technician

## 2013-12-10 ENCOUNTER — Other Ambulatory Visit (HOSPITAL_COMMUNITY): Payer: Self-pay

## 2013-12-20 ENCOUNTER — Encounter (HOSPITAL_COMMUNITY)
Admission: RE | Admit: 2013-12-20 | Discharge: 2013-12-20 | Disposition: A | Discharge status: Home / Self Care | Payer: Worker's Compensation | Source: Ambulatory Visit | Attending: Orthopedic Surgery | Admitting: Orthopedic Surgery

## 2013-12-20 ENCOUNTER — Other Ambulatory Visit (HOSPITAL_COMMUNITY): Payer: Self-pay | Admitting: *Deleted

## 2013-12-20 ENCOUNTER — Encounter (HOSPITAL_COMMUNITY)
Admission: RE | Admit: 2013-12-20 | Discharge: 2013-12-20 | Disposition: A | Discharge status: Home / Self Care | Payer: Worker's Compensation | Source: Ambulatory Visit | Attending: Anesthesiology | Admitting: Anesthesiology

## 2013-12-20 ENCOUNTER — Encounter (HOSPITAL_COMMUNITY): Payer: Self-pay

## 2013-12-20 DIAGNOSIS — Z01811 Encounter for preprocedural respiratory examination: Secondary | ICD-10-CM | POA: Insufficient documentation

## 2013-12-20 DIAGNOSIS — Z01810 Encounter for preprocedural cardiovascular examination: Secondary | ICD-10-CM | POA: Insufficient documentation

## 2013-12-20 DIAGNOSIS — Z01812 Encounter for preprocedural laboratory examination: Secondary | ICD-10-CM | POA: Insufficient documentation

## 2013-12-20 DIAGNOSIS — Z01818 Encounter for other preprocedural examination: Secondary | ICD-10-CM | POA: Insufficient documentation

## 2013-12-20 HISTORY — DX: Anemia, unspecified: D64.9

## 2013-12-20 HISTORY — DX: Adverse effect of unspecified anesthetic, initial encounter: T41.45XA

## 2013-12-20 HISTORY — DX: Other complications of anesthesia, initial encounter: T88.59XA

## 2013-12-20 HISTORY — DX: Headache: R51

## 2013-12-20 HISTORY — DX: Other specified postprocedural states: Z98.890

## 2013-12-20 HISTORY — DX: Other specified postprocedural states: R11.2

## 2013-12-20 LAB — CBC
HCT: 43.7 % (ref 36.0–46.0)
HEMOGLOBIN: 14.9 g/dL (ref 12.0–15.0)
MCH: 28.2 pg (ref 26.0–34.0)
MCHC: 34.1 g/dL (ref 30.0–36.0)
MCV: 82.8 fL (ref 78.0–100.0)
PLATELETS: 276 10*3/uL (ref 150–400)
RBC: 5.28 MIL/uL — ABNORMAL HIGH (ref 3.87–5.11)
RDW: 13.7 % (ref 11.5–15.5)
WBC: 8.3 10*3/uL (ref 4.0–10.5)

## 2013-12-20 LAB — SURGICAL PCR SCREEN
MRSA, PCR: NEGATIVE
Staphylococcus aureus: NEGATIVE

## 2013-12-20 LAB — BASIC METABOLIC PANEL
BUN: 18 mg/dL (ref 6–23)
CALCIUM: 9 mg/dL (ref 8.4–10.5)
CO2: 26 mEq/L (ref 19–32)
Chloride: 100 mEq/L (ref 96–112)
Creatinine, Ser: 0.69 mg/dL (ref 0.50–1.10)
Glucose, Bld: 94 mg/dL (ref 70–99)
Potassium: 3.8 mEq/L (ref 3.7–5.3)
Sodium: 142 mEq/L (ref 137–147)

## 2013-12-20 NOTE — Progress Notes (Signed)
Called Dr. Shon BatonBrooks' office at 10:35 AM and [name] to call back. Pt is concerned that the consent form did not state that Dr. Shon BatonBrooks was going to fuse neck from C4 to C6. She states that is what she understood that he was going to do after removing the hardware from C4-C5. I spoke with [name] when she returned my call and gave her this information. She states she will give Dr. Shon BatonBrooks the message.

## 2013-12-20 NOTE — Pre-Procedure Instructions (Signed)
Julia Jarvis  12/20/2013   Your procedure is scheduled on:  Wednesday, December 22, 2013 at 8:30 AM.   [facility] at 6:30 AM.   Call this number if you have problems the morning of surgery: [phone]   Remember:   Do not eat food or drink liquids after midnight Tuesday, 12/21/13.   Take these medicines the morning of surgery with A SIP OF WATER: oxyCODONE-acetaminophen (PERCOCET) - if needed.  Stop Mobic as of today, please bring brace with you day of surgery.    Do not wear jewelry, make-up or nail polish.  Do not wear lotions, powders, or perfumes. You may wear deodorant.  Do not shave 48 hours prior to surgery.   Do not bring valuables to the hospital.  [facility] not responsible                  for any belongings or valuables.               Contacts, dentures or bridgework may not be worn into surgery.  Leave suitcase in the car. After surgery it may be brought to your room.  For patients admitted to the hospital, discharge time is determined by your                treatment team.                 Special Instructions: Shower using CHG 2 nights before surgery and the night before surgery.  If you shower the day of surgery use CHG.  Use special wash - you have one bottle of CHG for all showers.  You should use approximately 1/3 of the bottle for each shower.   Please read over the following fact sheets that you were given: Pain Booklet, Coughing and Deep Breathing, MRSA Information and Surgical Site Infection Prevention

## 2013-12-21 MED ORDER — CEFAZOLIN SODIUM-DEXTROSE 2-3 GM-% IV SOLR
2.0000 g | INTRAVENOUS | Status: AC
  Administered 2013-12-22: 2 g via INTRAVENOUS
  Filled 2013-12-21: qty 50

## 2013-12-21 NOTE — Progress Notes (Signed)
Anesthesia Chart Review:  Patient is a 43 year old female scheduled for exploration of C4-5 fusion, removal of C5-6 hardware on 12/22/13 by Dr. Shon BatonBrooks.  She is s/p C4-5 ACDF on 12/09/12.  History includes non-smoker, obesity, HTN, post-operative N/V, anemia, asthma, headaches, hysterectomy. No PCP is listed. Notes from 2014 list PCP as Dr. Carrington ClampMark Wiser.  Preoperative CXR and labs noted.  EKG on 12/20/13 showed NSR, ST/T wave abnormality, consider lateral ischemia--increased since prior tracing on 12/09/12. No CV symptoms documented from her PAT visit. Notes from 11/2012 indicate she had a "negative" stress and echo around 2010.  She has no known history of CAD/MI/CHF, or DM. EKGs reviewed with anesthesiologist Dr. Chaney MallingHodierne.  Further evaluation on the day of surgery.  If she remains asymptomatic from a CV standpoint then would anticipate that she could proceed as planned.  Velna Ochsllison Justino Boze, PA-C [facility]Anesthesiology Phone [phone] 12/21/2013 12:43 PM

## 2013-12-22 ENCOUNTER — Encounter (HOSPITAL_COMMUNITY): Payer: Worker's Compensation | Admitting: Vascular Surgery

## 2013-12-22 ENCOUNTER — Observation Stay (HOSPITAL_COMMUNITY): Payer: Worker's Compensation

## 2013-12-22 ENCOUNTER — Encounter (HOSPITAL_COMMUNITY): Payer: Self-pay | Admitting: Anesthesiology

## 2013-12-22 ENCOUNTER — Ambulatory Visit (HOSPITAL_COMMUNITY): Payer: Worker's Compensation | Admitting: Anesthesiology

## 2013-12-22 ENCOUNTER — Observation Stay (HOSPITAL_COMMUNITY)
Admission: RE | Admit: 2013-12-22 | Discharge: 2013-12-23 | Disposition: A | Discharge status: Home / Self Care | Payer: Worker's Compensation | Source: Ambulatory Visit | Attending: Orthopedic Surgery | Admitting: Orthopedic Surgery

## 2013-12-22 ENCOUNTER — Ambulatory Visit (HOSPITAL_COMMUNITY): Payer: Worker's Compensation

## 2013-12-22 ENCOUNTER — Encounter (HOSPITAL_COMMUNITY)
Admission: RE | Disposition: A | Discharge status: Home / Self Care | Payer: Self-pay | Source: Ambulatory Visit | Attending: Orthopedic Surgery

## 2013-12-22 DIAGNOSIS — Z981 Arthrodesis status: Secondary | ICD-10-CM | POA: Insufficient documentation

## 2013-12-22 DIAGNOSIS — M542 Cervicalgia: Secondary | ICD-10-CM | POA: Diagnosis present

## 2013-12-22 DIAGNOSIS — I10 Essential (primary) hypertension: Secondary | ICD-10-CM | POA: Insufficient documentation

## 2013-12-22 DIAGNOSIS — Z472 Encounter for removal of internal fixation device: Secondary | ICD-10-CM | POA: Insufficient documentation

## 2013-12-22 DIAGNOSIS — R51 Headache: Secondary | ICD-10-CM | POA: Insufficient documentation

## 2013-12-22 DIAGNOSIS — M5020 Other cervical disc displacement, unspecified cervical region: Principal | ICD-10-CM | POA: Insufficient documentation

## 2013-12-22 HISTORY — PX: ANTERIOR CERVICAL DECOMP/DISCECTOMY FUSION: SHX1161

## 2013-12-22 SURGERY — ANTERIOR CERVICAL DECOMPRESSION/DISCECTOMY FUSION 1 LEVEL/HARDWARE REMOVAL
Anesthesia: General | Site: Neck

## 2013-12-22 MED ORDER — SODIUM CHLORIDE 0.9 % IV SOLN: 250.0000 mL | INTRAVENOUS | Status: DC

## 2013-12-22 MED ORDER — ROCURONIUM BROMIDE 50 MG/5ML IV SOLN
INTRAVENOUS | Status: AC
  Filled 2013-12-22: qty 1

## 2013-12-22 MED ORDER — FENTANYL CITRATE 0.05 MG/ML IJ SOLN
INTRAMUSCULAR | Status: DC | PRN
  Administered 2013-12-22: 150 ug via INTRAVENOUS
  Administered 2013-12-22: 100 ug via INTRAVENOUS
  Administered 2013-12-22 (×2): 50 ug via INTRAVENOUS
  Administered 2013-12-22: 150 ug via INTRAVENOUS

## 2013-12-22 MED ORDER — LISINOPRIL 20 MG PO TABS
20.0000 mg | ORAL_TABLET | Freq: Every day | ORAL | Status: DC
  Filled 2013-12-22 (×2): qty 1

## 2013-12-22 MED ORDER — NEOSTIGMINE METHYLSULFATE 1 MG/ML IJ SOLN
INTRAMUSCULAR | Status: AC
  Filled 2013-12-22: qty 10

## 2013-12-22 MED ORDER — MORPHINE SULFATE 2 MG/ML IJ SOLN
1.0000 mg | INTRAMUSCULAR | Status: DC | PRN
  Administered 2013-12-22: 2 mg via INTRAVENOUS
  Administered 2013-12-23: 4 mg via INTRAVENOUS
  Filled 2013-12-22: qty 1
  Filled 2013-12-22: qty 2

## 2013-12-22 MED ORDER — ONDANSETRON HCL 4 MG/2ML IJ SOLN
INTRAMUSCULAR | Status: AC
  Filled 2013-12-22: qty 2

## 2013-12-22 MED ORDER — THROMBIN 20000 UNITS EX SOLR
OROMUCOSAL | Status: DC | PRN
  Administered 2013-12-22: 09:00:00 via TOPICAL

## 2013-12-22 MED ORDER — BUPIVACAINE-EPINEPHRINE (PF) 0.25% -1:200000 IJ SOLN
INTRAMUSCULAR | Status: AC
  Filled 2013-12-22: qty 30

## 2013-12-22 MED ORDER — LABETALOL HCL 5 MG/ML IV SOLN
INTRAVENOUS | Status: DC | PRN
  Administered 2013-12-22 (×2): 5 mg via INTRAVENOUS

## 2013-12-22 MED ORDER — MIDAZOLAM HCL 5 MG/5ML IJ SOLN
INTRAMUSCULAR | Status: DC | PRN
  Administered 2013-12-22: 2 mg via INTRAVENOUS

## 2013-12-22 MED ORDER — THROMBIN 20000 UNITS EX SOLR
CUTANEOUS | Status: AC
  Filled 2013-12-22: qty 20000

## 2013-12-22 MED ORDER — ZOLPIDEM TARTRATE 5 MG PO TABS: 5.0000 mg | ORAL_TABLET | Freq: Every evening | ORAL | Status: DC | PRN

## 2013-12-22 MED ORDER — LIDOCAINE HCL (CARDIAC) 20 MG/ML IV SOLN
INTRAVENOUS | Status: AC
  Filled 2013-12-22: qty 5

## 2013-12-22 MED ORDER — LACTATED RINGERS IV SOLN
INTRAVENOUS | Status: DC | PRN
  Administered 2013-12-22: 08:00:00 via INTRAVENOUS

## 2013-12-22 MED ORDER — SUCCINYLCHOLINE CHLORIDE 20 MG/ML IJ SOLN
INTRAMUSCULAR | Status: AC
  Filled 2013-12-22: qty 1

## 2013-12-22 MED ORDER — ONDANSETRON HCL 4 MG/2ML IJ SOLN: 4.0000 mg | Freq: Once | INTRAMUSCULAR | Status: DC | PRN

## 2013-12-22 MED ORDER — OXYCODONE HCL 5 MG PO TABS
10.0000 mg | ORAL_TABLET | ORAL | Status: DC | PRN
  Administered 2013-12-22 – 2013-12-23 (×2): 10 mg via ORAL
  Filled 2013-12-22 (×2): qty 2

## 2013-12-22 MED ORDER — HYDROMORPHONE HCL PF 1 MG/ML IJ SOLN
0.2500 mg | INTRAMUSCULAR | Status: DC | PRN
  Administered 2013-12-22 (×2): 0.5 mg via INTRAVENOUS

## 2013-12-22 MED ORDER — LACTATED RINGERS IV SOLN
INTRAVENOUS | Status: DC
  Administered 2013-12-22: 21:00:00 via INTRAVENOUS

## 2013-12-22 MED ORDER — HYDROCHLOROTHIAZIDE 25 MG PO TABS
25.0000 mg | ORAL_TABLET | Freq: Every day | ORAL | Status: DC
  Filled 2013-12-22 (×2): qty 1

## 2013-12-22 MED ORDER — FENTANYL CITRATE 0.05 MG/ML IJ SOLN
INTRAMUSCULAR | Status: AC
  Filled 2013-12-22: qty 5

## 2013-12-22 MED ORDER — GLYCOPYRROLATE 0.2 MG/ML IJ SOLN
INTRAMUSCULAR | Status: AC
  Filled 2013-12-22: qty 3

## 2013-12-22 MED ORDER — ONDANSETRON HCL 4 MG/2ML IJ SOLN
4.0000 mg | INTRAMUSCULAR | Status: DC | PRN
  Administered 2013-12-22: 4 mg via INTRAVENOUS
  Filled 2013-12-22: qty 2

## 2013-12-22 MED ORDER — CEFAZOLIN SODIUM 1-5 GM-% IV SOLN
1.0000 g | Freq: Three times a day (TID) | INTRAVENOUS | Status: AC
  Administered 2013-12-22 – 2013-12-23 (×2): 1 g via INTRAVENOUS
  Filled 2013-12-22 (×2): qty 50

## 2013-12-22 MED ORDER — MENTHOL 3 MG MT LOZG
1.0000 | LOZENGE | OROMUCOSAL | Status: DC | PRN
  Filled 2013-12-22: qty 9

## 2013-12-22 MED ORDER — GLYCOPYRROLATE 0.2 MG/ML IJ SOLN
INTRAMUSCULAR | Status: AC
  Filled 2013-12-22: qty 1

## 2013-12-22 MED ORDER — ACETAMINOPHEN 10 MG/ML IV SOLN
1000.0000 mg | Freq: Four times a day (QID) | INTRAVENOUS | Status: AC
  Administered 2013-12-22 – 2013-12-23 (×4): 1000 mg via INTRAVENOUS
  Filled 2013-12-22 (×3): qty 100

## 2013-12-22 MED ORDER — MIDAZOLAM HCL 2 MG/2ML IJ SOLN
INTRAMUSCULAR | Status: AC
  Filled 2013-12-22: qty 2

## 2013-12-22 MED ORDER — LISINOPRIL-HYDROCHLOROTHIAZIDE 20-25 MG PO TABS: 1.0000 | ORAL_TABLET | Freq: Every day | ORAL | Status: DC

## 2013-12-22 MED ORDER — PROPOFOL 10 MG/ML IV BOLUS
INTRAVENOUS | Status: AC
  Filled 2013-12-22: qty 20

## 2013-12-22 MED ORDER — NEOSTIGMINE METHYLSULFATE 1 MG/ML IJ SOLN
INTRAMUSCULAR | Status: DC | PRN
  Administered 2013-12-22: 3 mg via INTRAVENOUS

## 2013-12-22 MED ORDER — HYDROMORPHONE HCL PF 1 MG/ML IJ SOLN
INTRAMUSCULAR | Status: AC
  Filled 2013-12-22: qty 1

## 2013-12-22 MED ORDER — PHENOL 1.4 % MT LIQD
1.0000 | OROMUCOSAL | Status: DC | PRN
  Administered 2013-12-23: 1 via OROMUCOSAL
  Filled 2013-12-22: qty 177

## 2013-12-22 MED ORDER — BUPIVACAINE-EPINEPHRINE 0.25% -1:200000 IJ SOLN
INTRAMUSCULAR | Status: DC | PRN
  Administered 2013-12-22: 6 mL

## 2013-12-22 MED ORDER — LABETALOL HCL 5 MG/ML IV SOLN
INTRAVENOUS | Status: AC
  Filled 2013-12-22: qty 4

## 2013-12-22 MED ORDER — MORPHINE SULFATE 10 MG/ML IJ SOLN
INTRAMUSCULAR | Status: AC
  Filled 2013-12-22: qty 1

## 2013-12-22 MED ORDER — MORPHINE SULFATE 10 MG/ML IJ SOLN
INTRAMUSCULAR | Status: DC | PRN
  Administered 2013-12-22 (×2): 2 mg via INTRAVENOUS

## 2013-12-22 MED ORDER — METHOCARBAMOL 500 MG PO TABS
500.0000 mg | ORAL_TABLET | Freq: Four times a day (QID) | ORAL | Status: DC | PRN
  Administered 2013-12-23: 500 mg via ORAL
  Filled 2013-12-22 (×2): qty 1

## 2013-12-22 MED ORDER — ONDANSETRON HCL 4 MG/2ML IJ SOLN
INTRAMUSCULAR | Status: DC | PRN
  Administered 2013-12-22: 4 mg via INTRAVENOUS

## 2013-12-22 MED ORDER — PROPOFOL 10 MG/ML IV BOLUS
INTRAVENOUS | Status: DC | PRN
  Administered 2013-12-22: 140 mg via INTRAVENOUS
  Administered 2013-12-22: 20 mg via INTRAVENOUS

## 2013-12-22 MED ORDER — SODIUM CHLORIDE 0.9 % IJ SOLN: 3.0000 mL | INTRAMUSCULAR | Status: DC | PRN

## 2013-12-22 MED ORDER — LIDOCAINE HCL (CARDIAC) 20 MG/ML IV SOLN
INTRAVENOUS | Status: DC | PRN
  Administered 2013-12-22: 60 mg via INTRAVENOUS

## 2013-12-22 MED ORDER — SODIUM CHLORIDE 0.9 % IJ SOLN
3.0000 mL | Freq: Two times a day (BID) | INTRAMUSCULAR | Status: DC
  Administered 2013-12-22: 3 mL via INTRAVENOUS

## 2013-12-22 MED ORDER — SCOPOLAMINE 1 MG/3DAYS TD PT72
MEDICATED_PATCH | TRANSDERMAL | Status: DC | PRN
  Administered 2013-12-22: 1 via TRANSDERMAL

## 2013-12-22 MED ORDER — ACETAMINOPHEN 10 MG/ML IV SOLN
1000.0000 mg | Freq: Four times a day (QID) | INTRAVENOUS | Status: DC
  Administered 2013-12-22: 1000 mg via INTRAVENOUS
  Filled 2013-12-22: qty 100

## 2013-12-22 MED ORDER — METHOCARBAMOL 100 MG/ML IJ SOLN
500.0000 mg | Freq: Four times a day (QID) | INTRAVENOUS | Status: DC | PRN
  Filled 2013-12-22: qty 5

## 2013-12-22 MED ORDER — ROCURONIUM BROMIDE 100 MG/10ML IV SOLN
INTRAVENOUS | Status: DC | PRN
  Administered 2013-12-22: 50 mg via INTRAVENOUS

## 2013-12-22 MED ORDER — GLYCOPYRROLATE 0.2 MG/ML IJ SOLN
INTRAMUSCULAR | Status: DC | PRN
  Administered 2013-12-22: 0.3 mg via INTRAVENOUS

## 2013-12-22 MED ORDER — METHOCARBAMOL 500 MG PO TABS
ORAL_TABLET | ORAL | Status: AC
  Administered 2013-12-22: 500 mg
  Filled 2013-12-22: qty 1

## 2013-12-22 MED ORDER — ACETAMINOPHEN 10 MG/ML IV SOLN
INTRAVENOUS | Status: AC
  Filled 2013-12-22: qty 100

## 2013-12-22 SURGICAL SUPPLY — 61 items
BENZOIN TINCTURE PRP APPL 2/3 (GAUZE/BANDAGES/DRESSINGS) IMPLANT
BLADE SURG ROTATE 9660 (MISCELLANEOUS) IMPLANT
BUR EGG ELITE 4.0 (BURR) IMPLANT
BUR EGG ELITE 4.0MM (BURR)
BUR MATCHSTICK NEURO 3.0 LAGG (BURR) IMPLANT
CANISTER SUCTION 2500CC (MISCELLANEOUS) ×3 IMPLANT
CLOSURE STERI-STRIP 1/2X4 (GAUZE/BANDAGES/DRESSINGS) ×1
CLOTH BEACON ORANGE TIMEOUT ST (SAFETY) ×3 IMPLANT
CLSR STERI-STRIP ANTIMIC 1/2X4 (GAUZE/BANDAGES/DRESSINGS) ×2 IMPLANT
CORDS BIPOLAR (ELECTRODE) ×3 IMPLANT
COVER SURGICAL LIGHT HANDLE (MISCELLANEOUS) ×6 IMPLANT
CRADLE DONUT ADULT HEAD (MISCELLANEOUS) ×3 IMPLANT
DRAPE C-ARM 42X72 X-RAY (DRAPES) ×3 IMPLANT
DRAPE POUCH INSTRU U-SHP 10X18 (DRAPES) ×3 IMPLANT
DRAPE SURG 17X23 STRL (DRAPES) ×3 IMPLANT
DRAPE U-SHAPE 47X51 STRL (DRAPES) ×6 IMPLANT
DRSG MEPILEX BORDER 4X4 (GAUZE/BANDAGES/DRESSINGS) ×3 IMPLANT
ELECT COATED BLADE 2.86 ST (ELECTRODE) ×3 IMPLANT
ELECT REM PT RETURN 9FT ADLT (ELECTROSURGICAL) ×3
ELECTRODE REM PT RTRN 9FT ADLT (ELECTROSURGICAL) ×1 IMPLANT
GLOVE BIOGEL PI IND STRL 8 (GLOVE) ×1 IMPLANT
GLOVE BIOGEL PI IND STRL 8.5 (GLOVE) ×1 IMPLANT
GLOVE BIOGEL PI INDICATOR 8 (GLOVE) ×2
GLOVE BIOGEL PI INDICATOR 8.5 (GLOVE) ×2
GLOVE ECLIPSE 8.5 STRL (GLOVE) ×6 IMPLANT
GLOVE ORTHO TXT STRL SZ7.5 (GLOVE) ×3 IMPLANT
GOWN STRL REIN 2XL XLG LVL4 (GOWN DISPOSABLE) ×3 IMPLANT
GOWN STRL REIN XL XLG (GOWN DISPOSABLE) ×6 IMPLANT
GOWN STRL REUS W/TWL 2XL LVL3 (GOWN DISPOSABLE) ×3 IMPLANT
INTERLOCK LRDTC CRVCL VBR 7MM (Bone Implant) ×1 IMPLANT
KIT BASIN OR (CUSTOM PROCEDURE TRAY) ×3 IMPLANT
KIT ROOM TURNOVER OR (KITS) ×3 IMPLANT
LORDOTIC CERVICAL VBR 7MM SM (Bone Implant) ×3 IMPLANT
NEEDLE SPNL 18GX3.5 QUINCKE PK (NEEDLE) ×3 IMPLANT
NS IRRIG 1000ML POUR BTL (IV SOLUTION) ×3 IMPLANT
PACK ORTHO CERVICAL (CUSTOM PROCEDURE TRAY) ×3 IMPLANT
PACK UNIVERSAL I (CUSTOM PROCEDURE TRAY) ×3 IMPLANT
PAD ARMBOARD 7.5X6 YLW CONV (MISCELLANEOUS) ×6 IMPLANT
PATTIES SURGICAL .25X.25 (GAUZE/BANDAGES/DRESSINGS) IMPLANT
PATTIES SURGICAL .5 X.5 (GAUZE/BANDAGES/DRESSINGS) IMPLANT
PIN DISTRACTION 14 (PIN) ×3 IMPLANT
PIN RETAINER PRODISC 14 MM (PIN) ×3 IMPLANT
PLATE SKYLINE 12MM (Plate) ×3 IMPLANT
PUTTY BONE DBX 2.5 MIS (Bone Implant) ×3 IMPLANT
RESTRAINT LIMB HOLDER UNIV (RESTRAINTS) ×3 IMPLANT
SCREW SKYLINE 14MM SD-VA (Screw) ×6 IMPLANT
SCREW SKYLINE VAR OS 14MM (Screw) ×3 IMPLANT
SPONGE INTESTINAL PEANUT (DISPOSABLE) ×6 IMPLANT
SPONGE LAP 4X18 X RAY DECT (DISPOSABLE) IMPLANT
SPONGE SURGIFOAM ABS GEL 100 (HEMOSTASIS) ×3 IMPLANT
SURGIFLO TRUKIT (HEMOSTASIS) IMPLANT
SUT MON AB 3-0 SH 27 (SUTURE) ×2
SUT MON AB 3-0 SH27 (SUTURE) ×1 IMPLANT
SUT VIC AB 2-0 CT1 36 (SUTURE) ×3 IMPLANT
SYR CONTROL 10ML LL (SYRINGE) ×3 IMPLANT
TAPE CLOTH 4X10 WHT NS (GAUZE/BANDAGES/DRESSINGS) ×3 IMPLANT
TAPE UMBILICAL COTTON 1/8X30 (MISCELLANEOUS) ×6 IMPLANT
TOWEL OR 17X24 6PK STRL BLUE (TOWEL DISPOSABLE) ×3 IMPLANT
TOWEL OR 17X26 10 PK STRL BLUE (TOWEL DISPOSABLE) ×3 IMPLANT
TRAY FOLEY CATH 16FRSI W/METER (SET/KITS/TRAYS/PACK) IMPLANT
WATER STERILE IRR 1000ML POUR (IV SOLUTION) ×3 IMPLANT

## 2013-12-22 NOTE — Transfer of Care (Signed)
Immediate Anesthesia Transfer of Care Note  [facility]  Procedure(s) Performed: Procedure(s): ACDF C5 - C6 1 LEVEL/HARDWARE REMOVAL/REMOVAL OF CERVICAL PLATES AND EXPLORATION OF FUSION C4 - C5 (N/A)  Patient Location: PACU  Anesthesia Type:General  Level of Consciousness: awake, alert , sedated and patient cooperative  Airway & Oxygen Therapy: Patient Spontanous Breathing and Patient connected to face mask oxygen  Post-op Assessment: Report given to PACU RN, Post -op Vital signs reviewed and stable, Patient moving all extremities and Patient moving all extremities X 4  Post vital signs: Reviewed and stable  Complications: No apparent anesthesia complications

## 2013-12-22 NOTE — Anesthesia Postprocedure Evaluation (Signed)
  Anesthesia Post-op Note  Patient: [facility]  Procedure(s) Performed: Procedure(s): ACDF C5 - C6 1 LEVEL/HARDWARE REMOVAL/REMOVAL OF CERVICAL PLATES AND EXPLORATION OF FUSION C4 - C5 (N/A)  Patient Location: PACU  Anesthesia Type:General  Level of Consciousness: awake, alert  and oriented  Airway and Oxygen Therapy: Patient Spontanous Breathing and Patient connected to nasal cannula oxygen  Post-op Pain: mild  Post-op Assessment: Post-op Vital signs reviewed, Patient's Cardiovascular Status Stable, Respiratory Function Stable, Patent Airway, No signs of Nausea or vomiting and Pain level controlled  Post-op Vital Signs: stable  Complications: No apparent anesthesia complications

## 2013-12-22 NOTE — Brief Op Note (Signed)
12/22/2013  11:19 AM  PATIENT:  Winfield RastLisa Y Gibbs  43 y.o. female  PRE-OPERATIVE DIAGNOSIS:  Adjacent segment cervical spondylosis  POST-OPERATIVE DIAGNOSIS:  Adjacent segment cervical spondylosis  PROCEDURE:  Procedure(s): ACDF C5 - C6 1 LEVEL/HARDWARE REMOVAL/REMOVAL OF CERVICAL PLATES AND EXPLORATION OF FUSION C4 - C5 (N/A)  SURGEON:  Surgeon(s) and Role:    * Venita Lickahari Kyal Arts, MD - Primary  PHYSICIAN ASSISTANT:   ASSISTANTS: Zonia Kiefjames owens   ANESTHESIA:   general  EBL:  Total I/O In: 1000 [I.V.:1000] Out: -   BLOOD ADMINISTERED:none  DRAINS: none   LOCAL MEDICATIONS USED:  MARCAINE     SPECIMEN:  No Specimen  DISPOSITION OF SPECIMEN:  N/A  COUNTS:  YES  TOURNIQUET:  * No tourniquets in log *  DICTATION: .Other Dictation: Dictation Number [id number]  PLAN OF CARE: Admit for overnight observation  PATIENT DISPOSITION:  PACU - hemodynamically stable.

## 2013-12-22 NOTE — H&P (Signed)
No change in clinical exam H+P reviewed  

## 2013-12-22 NOTE — Anesthesia Preprocedure Evaluation (Signed)
Anesthesia Evaluation  Patient identified by MRN, date of birth, ID band Patient awake    Reviewed: Allergy & Precautions, H&P , NPO status , Patient's Chart, lab work & pertinent test results  Airway Mallampati: II TM Distance: >3 FB     Dental  (+) Partial Lower and Dental Advisory Given   Pulmonary  breath sounds clear to auscultation        Cardiovascular hypertension, Rhythm:Regular Rate:Normal     Neuro/Psych    GI/Hepatic   Endo/Other    Renal/GU      Musculoskeletal   Abdominal   Peds  Hematology   Anesthesia Other Findings   Reproductive/Obstetrics                           Anesthesia Physical Anesthesia Plan  ASA: II  Anesthesia Plan: General   Post-op Pain Management:    Induction: Intravenous  Airway Management Planned: Oral ETT  Additional Equipment:   Intra-op Plan:   Post-operative Plan: Extubation in OR  Informed Consent: I have reviewed the patients History and Physical, chart, labs and discussed the procedure including the risks, benefits and alternatives for the proposed anesthesia with the patient or authorized representative who has indicated his/her understanding and acceptance.   Dental advisory given  Plan Discussed with: CRNA and Anesthesiologist  Anesthesia Plan Comments: (Cervical spondylosis with painful retained hardware htn Mild obesity H/O post-op N/V  Plan GA with oral ETT)        Anesthesia Quick Evaluation

## 2013-12-22 NOTE — Progress Notes (Signed)
Pt. Wanting to talk to Dr. Shon BatonBrooks concerning the wording on the consent.  When documenting on the checklist in epic I made a note that pt. Did not sign the consent, however, when trying to verify  The system keeps saying the consent section is incomplete. I marked the consent and now it states the pt. Has given consent to the procedure and signed, which is not true. There is not another option for me to mark for the consent.

## 2013-12-22 NOTE — Anesthesia Procedure Notes (Signed)
Procedure Name: Intubation Date/Time: 12/22/2013 8:42 AM Performed by: Coralee RudFLORES, Nakyia Dau Pre-anesthesia Checklist: Patient identified, Emergency Drugs available, Suction available and Patient being monitored Patient Re-evaluated:Patient Re-evaluated prior to inductionOxygen Delivery Method: Circle system utilized Preoxygenation: Pre-oxygenation with 100% oxygen Intubation Type: IV induction Ventilation: Mask ventilation without difficulty and Oral airway inserted - appropriate to patient size Laryngoscope Size: Miller and 3 Grade View: Grade II Tube type: Oral Tube size: 7.5 mm Number of attempts: 1 Airway Equipment and Method: Stylet Placement Confirmation: ETT inserted through vocal cords under direct vision and positive ETCO2 Secured at: 21 cm Tube secured with: Tape Dental Injury: Teeth and Oropharynx as per pre-operative assessment

## 2013-12-22 NOTE — Preoperative (Signed)
Beta Blockers   Reason not to administer Beta Blockers:Not Applicable 

## 2013-12-23 ENCOUNTER — Encounter (HOSPITAL_COMMUNITY): Payer: Self-pay | Admitting: Orthopedic Surgery

## 2013-12-23 MED ORDER — ONDANSETRON HCL 4 MG PO TABS: 4.0000 mg | ORAL_TABLET | Freq: Three times a day (TID) | ORAL | Status: AC | PRN

## 2013-12-23 MED ORDER — METHOCARBAMOL 500 MG PO TABS: 500.0000 mg | ORAL_TABLET | Freq: Three times a day (TID) | ORAL | Status: AC | PRN

## 2013-12-23 MED ORDER — OXYCODONE-ACETAMINOPHEN 10-325 MG PO TABS: 1.0000 | ORAL_TABLET | ORAL | Status: AC | PRN

## 2013-12-23 NOTE — Discharge Summary (Signed)
Patient ID: Julia Jarvis MRN: [id number] DOB/AGE: 44-Mar-1972 44 y.o.  Admit date: 12/22/2013 Discharge date: 12/23/2013  Admission Diagnoses:  Active Problems:   * No active hospital problems. *   Discharge Diagnoses:  Active Problems:   * No active hospital problems. *  status post Procedure(s): ACDF C5 - C6 1 LEVEL/HARDWARE REMOVAL/REMOVAL OF CERVICAL PLATES AND EXPLORATION OF FUSION C4 - C5  Past Medical History  Diagnosis Date  . Hypertension 2008  . Asthma     childhood  . Headache(784.0)     headaches -"mostly from neck pain"  hx of migraines  . Anemia     during pregnancy  . Complication of anesthesia   . PONV (postoperative nausea and vomiting)     pt states that she did have N/V after neck surgery in 2014.    Surgeries: Procedure(s): ACDF C5 - C6 1 LEVEL/HARDWARE REMOVAL/REMOVAL OF CERVICAL PLATES AND EXPLORATION OF FUSION C4 - C5 on 12/22/2013   Consultants:  none  Discharged Condition: Improved  Hospital Course: Julia Jarvis is an 43 y.o. female who was admitted 12/22/2013 for operative treatment of <principal problem not specified>. Patient failed conservative treatments (please see the history and physical for the specifics) and had severe unremitting pain that affects sleep, daily activities and work/hobbies. After pre-op clearance, the patient was taken to the operating room on 12/22/2013 and underwent  Procedure(s): ACDF C5 - C6 1 LEVEL/HARDWARE REMOVAL/REMOVAL OF CERVICAL PLATES AND EXPLORATION OF FUSION C4 - C5.    Patient was given perioperative antibiotics: Anti-infectives   Start     Dose/Rate Route Frequency Ordered Stop   12/22/13 1700  ceFAZolin (ANCEF) IVPB 1 g/50 mL premix     1 g 100 mL/hr over 30 Minutes Intravenous Every 8 hours 12/22/13 1509 12/23/13 0035   12/21/13 1427  ceFAZolin (ANCEF) IVPB 2 g/50 mL premix     2 g 100 mL/hr over 30 Minutes Intravenous 30 min pre-op 12/21/13 1427 12/22/13 0905       Patient was given  sequential compression devices and early ambulation to prevent DVT.   Patient benefited maximally from hospital stay and there were no complications. At the time of discharge, the patient was urinating/moving their bowels without difficulty, tolerating a regular diet, pain is controlled with oral pain medications and they have been cleared by PT/OT.   Recent vital signs: Patient Vitals for the past 24 hrs:  BP Temp Temp src Pulse Resp SpO2  12/23/13 0818 133/83 mmHg 98.2 F (36.8 C) - 73 18 96 %  12/23/13 0222 134/96 mmHg 98.5 F (36.9 C) Oral 66 20 96 %  12/22/13 2330 128/77 mmHg 98.4 F (36.9 C) Oral 83 18 93 %  12/22/13 2012 136/87 mmHg 98.7 F (37.1 C) Oral 94 16 95 %  12/22/13 1631 - 97.9 F (36.6 C) - 78 18 96 %  12/22/13 1430 - 98.1 F (36.7 C) - 65 13 97 %  12/22/13 1415 - - - 62 12 97 %  12/22/13 1400 - - - 61 11 97 %  12/22/13 1346 - - - 82 15 98 %  12/22/13 1345 - - - 96 26 97 %  12/22/13 1330 - - - 62 11 97 %  12/22/13 1315 - - - 54 12 97 %  12/22/13 1300 - - - 57 11 97 %  12/22/13 1245 - - - 57 13 91 %  12/22/13 1241 - - - 76 13 94 %  12/22/13 1230 - - -  56 15 100 %  12/22/13 1215 - - - 57 13 100 %  12/22/13 1200 - - - 70 14 99 %  12/22/13 1145 - - - 60 13 100 %  12/22/13 1135 132/91 mmHg 98.3 F (36.8 C) - 71 14 100 %     Recent laboratory studies:  Recent Labs  12/20/13 0856  WBC 8.3  HGB 14.9  HCT 43.7  PLT 276  NA 142  K 3.8  CL 100  CO2 26  BUN 18  CREATININE 0.69  GLUCOSE 94  CALCIUM 9.0     Discharge Medications:     Medication List    STOP taking these medications       HYDROcodone-acetaminophen 10-325 MG per tablet  Commonly known as:  NORCO     MOBIC PO      TAKE these medications       lisinopril-hydrochlorothiazide 20-25 MG per tablet  Commonly known as:  PRINZIDE,ZESTORETIC  Take 1 tablet by mouth daily.     methocarbamol 500 MG tablet  Commonly known as:  ROBAXIN  Take 1 tablet (500 mg total) by mouth 3 (three) times  daily as needed for muscle spasms.     ondansetron 4 MG tablet  Commonly known as:  ZOFRAN  Take 1 tablet (4 mg total) by mouth every 8 (eight) hours as needed for nausea or vomiting.     oxyCODONE-acetaminophen 10-325 MG per tablet  Commonly known as:  PERCOCET  Take 1 tablet by mouth every 4 (four) hours as needed for pain.        Diagnostic Studies: Dg Chest 2 View  12/20/2013   CLINICAL DATA:  Preop  EXAM: CHEST  2 VIEW  COMPARISON:  12/09/2012  FINDINGS: Cardiomediastinal silhouette is stable. No acute infiltrate or pleural effusion. No pulmonary edema. Bony thorax is unremarkable.  IMPRESSION: No active cardiopulmonary disease.   Electronically Signed   By: Natasha Mead M.D.   On: 12/20/2013 12:16   Dg Cervical Spine 2-3 Views  12/22/2013   CLINICAL DATA:  ACDF  EXAM: CERVICAL SPINE - 2-3 VIEW  COMPARISON:  12/20/2013  FINDINGS: C-arm images show interbody fusion material remaining at the C4-5 level. Anterior plate and screws of been removed. ACDF has been performed at the C5-6 level with interbody fusion material and anterior plate and screw fixation. Components appear well positioned. No radiographically detectable complication.  IMPRESSION: Extension of the ACDF through the C5-6 level as outlined above.   Electronically Signed   By: Paulina Fusi M.D.   On: 12/22/2013 14:32   Dg Cervical Spine 2 Or 3 Views  12/22/2013   CLINICAL DATA:  Postoperative study.  EXAM: CERVICAL SPINE - 2-3 VIEW  COMPARISON:  Intraoperative film from 12/22/2013. Preoperative study from 12/20/2013.  FINDINGS: Previously noted plate and screw fixation device at C4-C5 has been removed. Postoperative changes of ACDF are noted at C5-C6. Interbody spacers are noted at the C4-C5 and C5-C6 interspaces. Prevertebral soft tissues appear mildly swollen (within normal limits in this postoperative patient).  IMPRESSION: 1. Removal of ACDF hardware at C4-C5 with new ACDF at C5-C6, with interbody spacer at C5-C6. Old interbody  spacer at C4-C5 remains in position.   Electronically Signed   By: Trudie Reed M.D.   On: 12/22/2013 12:44   Dg Cervical Spine 2 Or 3 Views  12/20/2013   CLINICAL DATA:  Preoperative study for spinal fusion.  EXAM: CERVICAL SPINE - 2-3 VIEW  COMPARISON:  AP and lateral view dated  March 15, 2013  FINDINGS: The patient has undergone previous sign rib pole anterior cervical fusion at the C4 and C5 levels. The vertebral bodies are preserved in height. There are mild degenerative disc changes at C5-6 and at C6-7 which are not new. There are mild degenerative facet joint changes at C4-5, C5-6 and C6-7. The spinous processes are intact. The observed portions of the first and second ribs appear normal.  IMPRESSION: The patient has undergone previous anterior fusion at C4-5. There are mild degenerative disc changes at C5-6 and at C6-7 with milder facet joint changes at these levels and at the 2 levels above.   Electronically Signed   By: David  [place]azil[place]   On: 12/20/2013 10:48          Follow-up Information   Follow up with Alvy BealBROOKS,Raenette Sakata D, MD. Schedule an appointment as soon as possible for a visit in 2 weeks.   Specialty:  Orthopedic Surgery   Contact information:   [address] [place] [address] [phone]       Discharge Plan:  discharge to home  Disposition: stable    Signed: Venita LickBROOKS,Aubriana Ravelo D for Dr. Venita Lickahari [facility] [phone] 12/23/2013, 8:19 AM

## 2013-12-23 NOTE — Op Note (Signed)
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  POSTOPERATIVE DIAGNOSIS:  Adjacent segment cervical disk herniation C5- 6.  OPERATIVE PROCEDURES: 1. Anterior cervical diskectomy and fusion C5-6 using the Titan     titanium size 7 small lordotic intervertebral cage with DBX mix and     a size 12 anterior cervical and diffuse guideline plate. 2. Removal of anterior cervical plate F6-2C4-5, exploration of previous     fusion C4-5.  FIRST ASSISTANT:  Genene ChurnJames M. Denton Meekwens, P.A.  COMPLICATIONS:  None.  CONDITION:  Stable.  HISTORY:  This is a very pleasant woman who approximately a year ago, had an anterior cervical diskectomy and fusion.  She initially did well and then started complaining of new radicular arm pain.  Clinical and radiographic analysis confirmed C5-6 adjacent segment disk protrusion causing nerve irritation.  Because of the failure of conservative management, she elected to proceed with surgery.  All appropriate risks, benefits, and alternatives were discussed with the patient and consent was obtained.  OPERATIVE NOTE:  The patient was brought to the operating room, placed supine on the operating table.  After successful induction of general anesthesia and endotracheal intubation, TEDs, SCDs were applied, and the anterior cervical spine was prepped and draped in a standard fashion. Time-out was done confirming the patient, procedure, and all other pertinent important data.  Once this was completed, a transverse incision site was mapped out on the right side, and infiltrated with 0.25% Marcaine.  The transverse incision was made, centered  over the C5 vertebral body.  Sharp dissection was carried out down to and through the platysma.  I then sharply dissected along the medial border of sternocleidomastoid through the adipose tissue and deep cervical fascia and prevertebral fascia.  I then swept with a finger, the trachea and esophagus to the left and palpated the carotid pulse and protected the sheath with a finger laterally.  I could now just visualize the anterior plate through the remaining prevertebral fascia.  Using [employer] Engineering[employer]ittner dissectors, I completed my dissection until I could expose the anterior cervical plate at Z3-04-5 level.  A handheld retractor was placed to retract the trachea and esophagus, and then I completely removed all the overhanging scar tissue from the plate.  I then used the appropriate removal devices to remove all 4 locking screws and the cervical plate. All screws were removed and the plate was intact.  I then identified the cage that was present and then palpated.  The cage itself was secured in place.  It was not mobile and there was bone inside the cage and growing from the C4 to the C5 vertebral body.  This was a solid fusion.  Once this was confirmed, I continued my dissection with Kittner's inferiorly until I was down to the midportion of C6 vertebral body.  I  then placed self-retaining retractors into the wound and held them in place.  I did deflate and reinflate the endotracheal cuff prior to and following placement of these retractors.  I then placed distraction pins into the body of C6 and C5, and I gently distracted the space.  I then incised the annulus with a 15 blade scalpel, and then using a combination of pituitary rongeurs and curettes, I removed the bulk of the disk material.  I then used a 2 mm Kerrison rongeur to remove the overhanging osteophyte from the inferior aspect of the C5 vertebral body.  I then continued my dissection posteriorly and then gently distracting this space.  I  then released the posterior anulus using a fine nerve hook, and then used a 1 mm Kerrison to remove the osteophytes from the posterior aspect of the vertebral body of C5 and 6.  I then swept the nerve hook out into the right lateral corner and resected some of the osteophytes from the uncovertebral joint.  I removed the small bone fragment and a piece of disk material from the right posterolateral corner.  At this point, I had an excellent decompression.  I then rasped the endplates, which had bleeding subchondral bone.  I then trialed with various sizes.  I then obtained the 7 small Titan titanium cage, packed with DBX mix and malleted to the appropriate depth.  I had excellent purchase.  The cage was well seated.  I then removed the distraction pins.  I then applied an anterior cervical plate.  Into the body of C5, I used rescue screws.  Into the body of C6, I used regular standard 14 mm self-drilling screws.  All screws had excellent purchase.  Final x- rays demonstrated the screws were well positioned, and did not violate the posterior vertebral cortex.  I then locked the screws in place according to manufacture standards and then removed the self-retaining retractor.  I then copiously irrigated with normal saline, obtained hemostasis, and returned the trachea and esophagus to the midline position.  I closed the platysma with interrupted 2-0 Vicryl sutures, and then the skin with 3-0 Monocryl.  Steri-Strips and dry dressing were applied.  The patient was extubated, transferred to PACU without incident.  First assistant was Genene Churn. Owens using instrumental in suturing wound closure suction and retraction for visualization.     Alvy Beal, MD     DDB/MEDQ  D:  12/22/2013  T:  12/23/2013  Job:  [id number]

## 2013-12-23 NOTE — Progress Notes (Signed)
Pt and husband given D/C instructions with Rx's, verbal understanding given. Pt D/C'd home via wheelchair @ [phone] per MD order. Pt was stable @ D/C and had no other needs. Rema FendtAshley Abegail Kloeppel, RN

## 2013-12-23 NOTE — Discharge Instructions (Signed)

## 2013-12-23 NOTE — Progress Notes (Signed)
    Subjective: Procedure(s) (LRB): ACDF C5 - C6 1 LEVEL/HARDWARE REMOVAL/REMOVAL OF CERVICAL PLATES AND EXPLORATION OF FUSION C4 - C5 (N/A) 1 Day Post-Op  Patient reports pain as 2 on 0-10 scale.  Reports decreased arm pain reports incisional neck pain   Positive void Negative bowel movement Positive flatus Negative chest pain or shortness of breath  Objective: Vital signs in last 24 hours: Temp:  [97.9 F (36.6 C)-98.7 F (37.1 C)] 98.5 F (36.9 C) (01/29 0222) Pulse Rate:  [54-96] 66 (01/29 0222) Resp:  [11-26] 20 (01/29 0222) BP: (128-136)/(77-96) 134/96 mmHg (01/29 0222) SpO2:  [91 %-100 %] 96 % (01/29 0222)  Intake/Output from previous day: 01/28 0701 - 01/29 0700 In: 1000 [I.V.:1000] Out: 500 [Emesis/NG output:500]  Labs:  Recent Labs  12/20/13 0856  WBC 8.3  RBC 5.28*  HCT 43.7  PLT 276    Recent Labs  12/20/13 0856  NA 142  K 3.8  CL 100  CO2 26  BUN 18  CREATININE 0.69  GLUCOSE 94  CALCIUM 9.0   No results found for this basename: LABPT, INR,  in the last 72 hours  Physical Exam: Neurologically intact ABD soft Neurovascular intact Intact pulses distally Incision: dressing C/D/I and no drainage Compartment soft  Assessment/Plan: Patient stable  xrays satisfactory Mobilization with physical therapy Encourage incentive spirometry Continue care  Advance diet Up with therapy D/C IV fluids Ok for d/c to home F/u 2 weeks  Venita Lickahari Adriane Gabbert, MD [facility] [phone]

## 2014-01-06 ENCOUNTER — Encounter (HOSPITAL_COMMUNITY): Payer: Self-pay | Admitting: Orthopedic Surgery

## 2014-01-06 NOTE — OR Nursing (Signed)
Late entry on 01-06-14 to add procedure end time by D. Lasharon Dunivan, RN 

## 2015-10-17 IMAGING — DX DG CERVICAL SPINE 2 OR 3 VIEWS
3 series · 3 of 3 positions shown · non-contrast
Comparison: Intraoperative film from 12/22/2013. Preoperative study
from 12/20/2013.

CLINICAL DATA: Postoperative study.

EXAM:
CERVICAL SPINE - 2-3 VIEW

[lat]
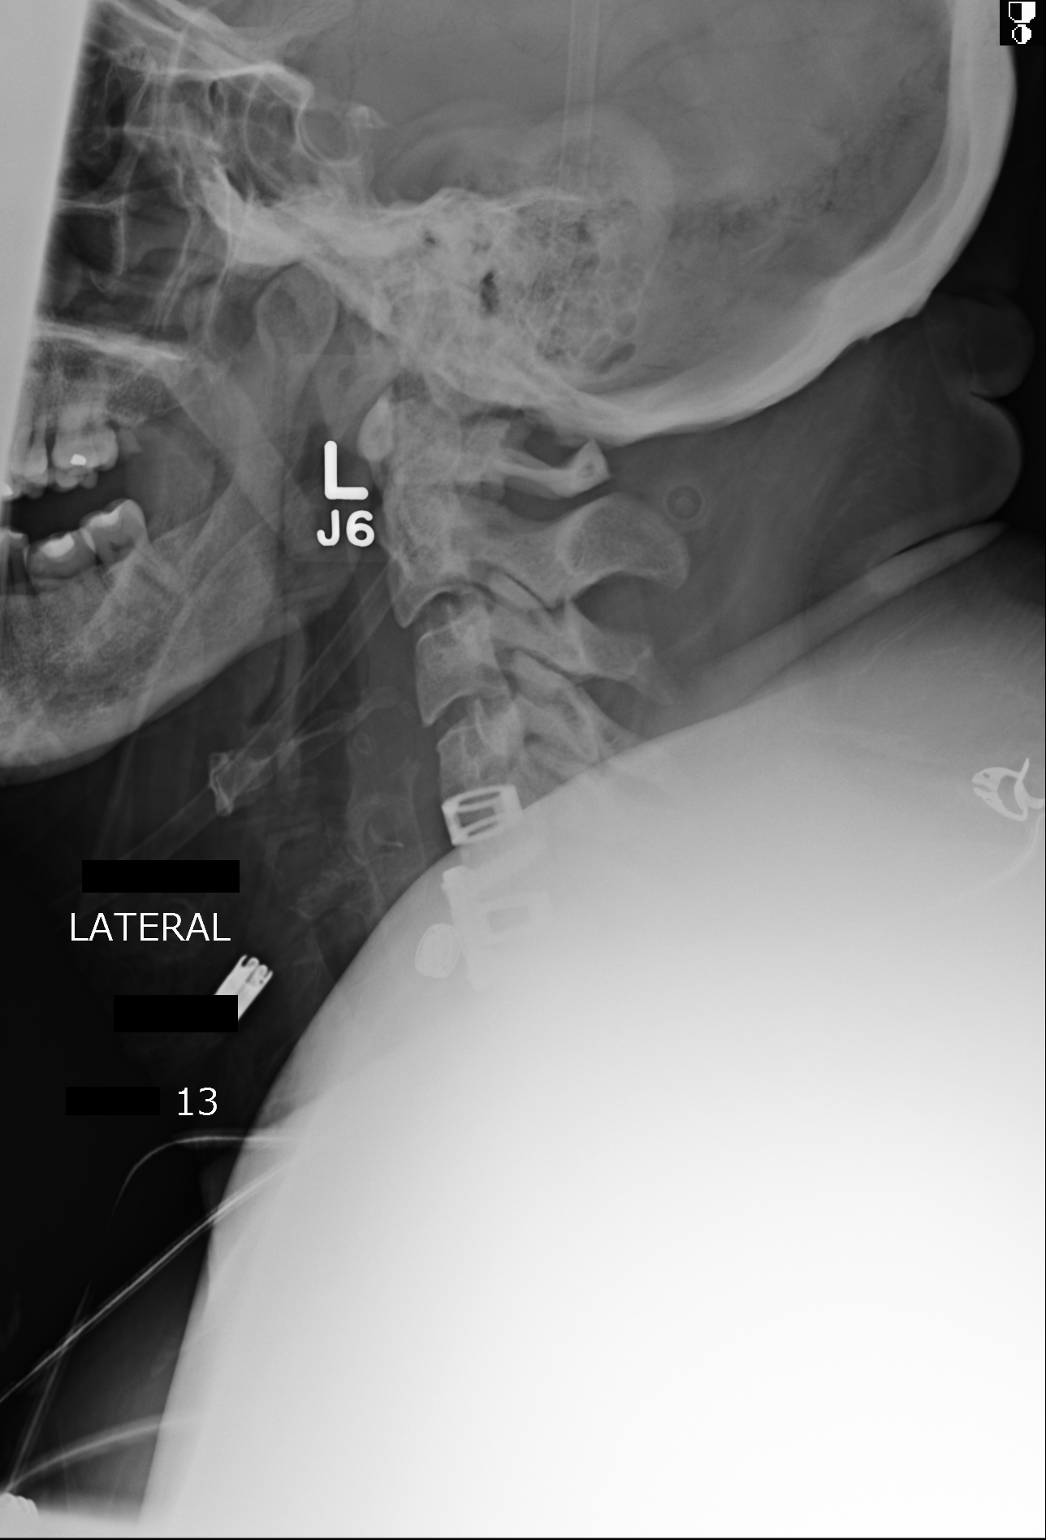

[ap]
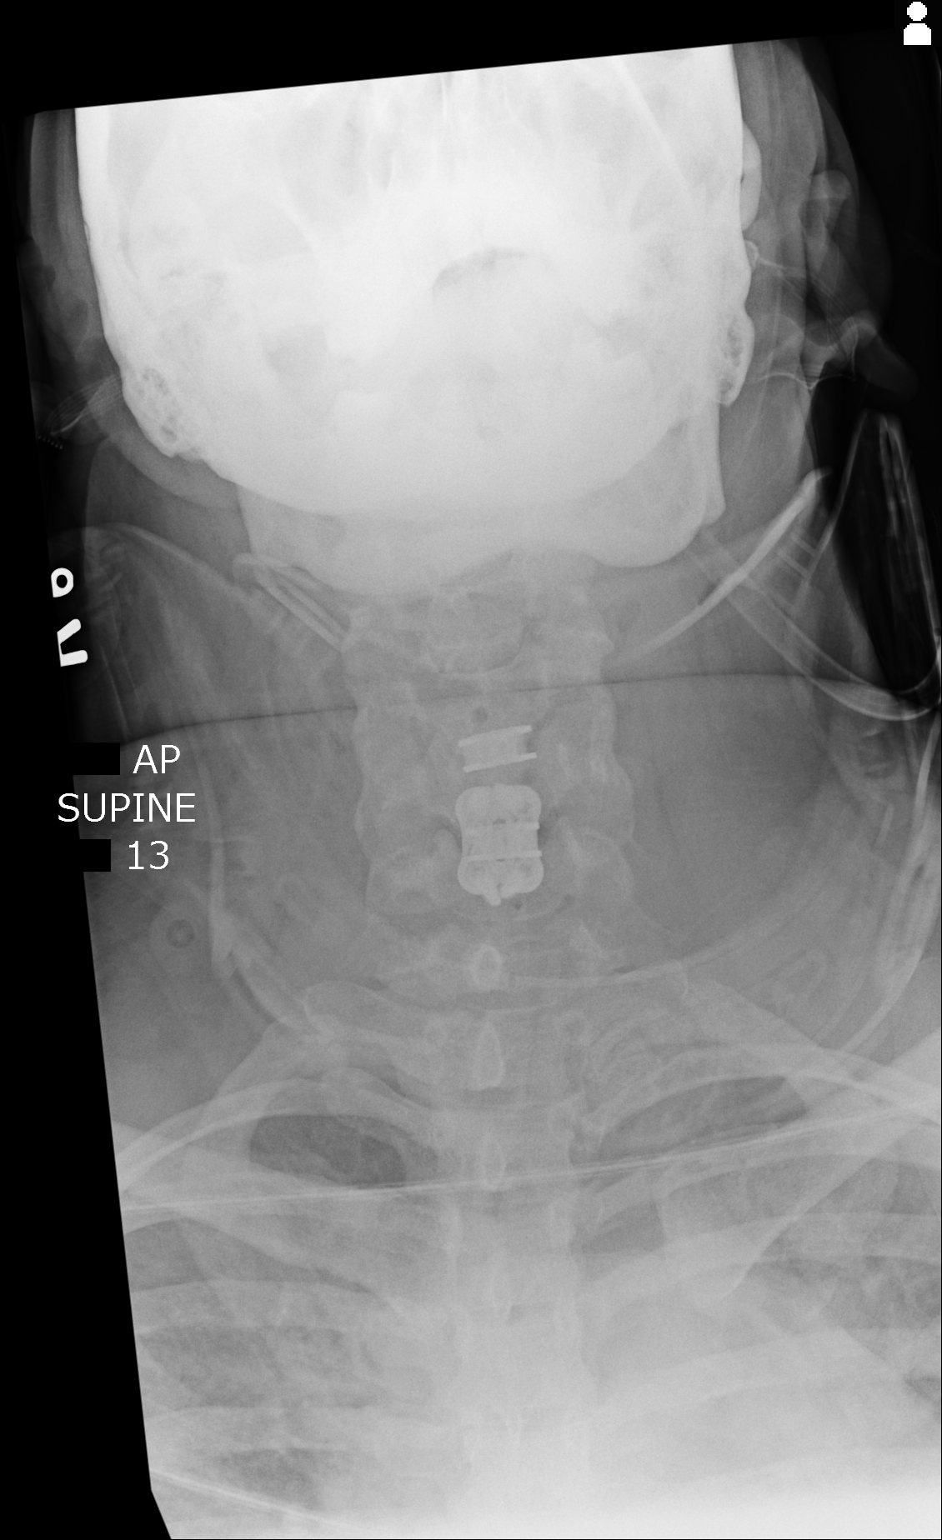

[odontoid]
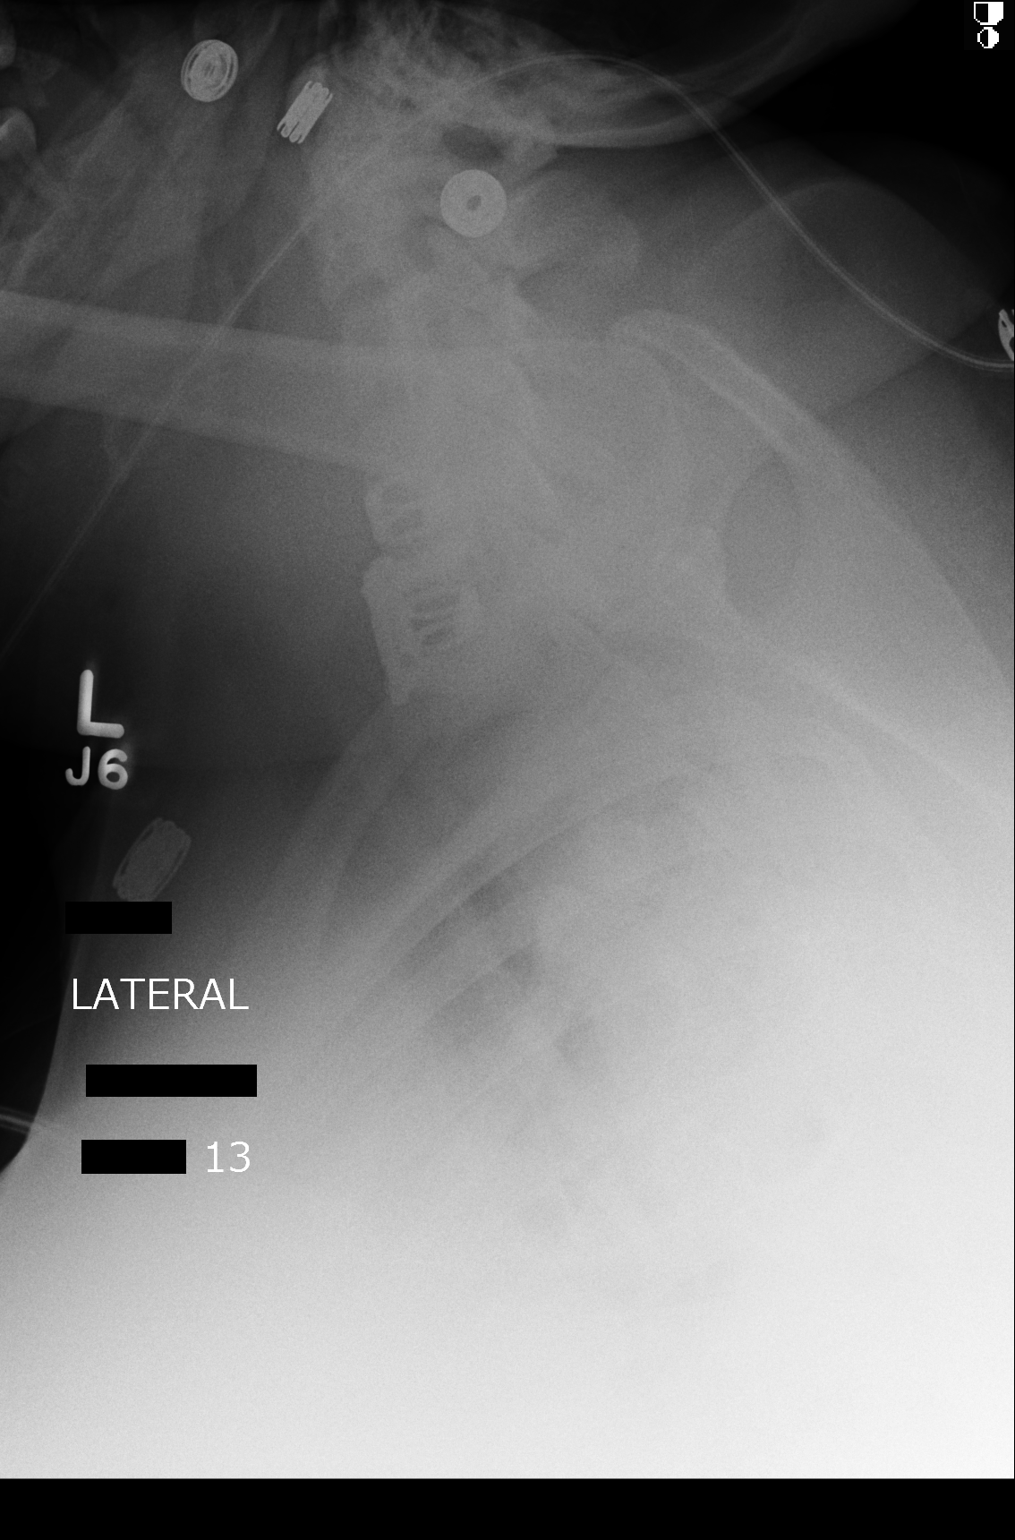

[3 of 3 positions shown; findings below may reference images not displayed]

FINDINGS: Previously noted plate and screw fixation device at C4-C5 has been
removed. Postoperative changes of ACDF are noted at C5-C6. Interbody
spacers are noted at the C4-C5 and C5-C6 interspaces. Prevertebral
soft tissues appear mildly swollen (within normal limits in this
postoperative patient).
IMPRESSION: 1. Removal of ACDF hardware at C4-C5 with new ACDF at C5-C6, with
interbody spacer at C5-C6. Old interbody spacer at C4-C5 remains in
position.
